# Patient Record
Sex: Female | Born: 1990 | Race: White | Hispanic: No | Marital: Single | Smoking: Current every day smoker
Health system: Southern US, Community
[De-identification: ages and names within clinical notes are randomized; demographics above are authoritative.]

## PROBLEM LIST (undated history)

## (undated) ENCOUNTER — Inpatient Hospital Stay (HOSPITAL_COMMUNITY): Payer: Self-pay

## (undated) DIAGNOSIS — K589 Irritable bowel syndrome without diarrhea: Secondary | ICD-10-CM

## (undated) DIAGNOSIS — F319 Bipolar disorder, unspecified: Secondary | ICD-10-CM

## (undated) DIAGNOSIS — F209 Schizophrenia, unspecified: Secondary | ICD-10-CM

## (undated) DIAGNOSIS — F329 Major depressive disorder, single episode, unspecified: Secondary | ICD-10-CM

## (undated) DIAGNOSIS — R3915 Urgency of urination: Secondary | ICD-10-CM

## (undated) DIAGNOSIS — O10919 Unspecified pre-existing hypertension complicating pregnancy, unspecified trimester: Secondary | ICD-10-CM

## (undated) DIAGNOSIS — R351 Nocturia: Secondary | ICD-10-CM

## (undated) DIAGNOSIS — F909 Attention-deficit hyperactivity disorder, unspecified type: Secondary | ICD-10-CM

## (undated) DIAGNOSIS — N200 Calculus of kidney: Secondary | ICD-10-CM

## (undated) DIAGNOSIS — O169 Unspecified maternal hypertension, unspecified trimester: Secondary | ICD-10-CM

## (undated) DIAGNOSIS — N289 Disorder of kidney and ureter, unspecified: Secondary | ICD-10-CM

## (undated) DIAGNOSIS — R350 Frequency of micturition: Secondary | ICD-10-CM

## (undated) DIAGNOSIS — F32A Depression, unspecified: Secondary | ICD-10-CM

## (undated) DIAGNOSIS — K649 Unspecified hemorrhoids: Secondary | ICD-10-CM

## (undated) DIAGNOSIS — F419 Anxiety disorder, unspecified: Secondary | ICD-10-CM

## (undated) DIAGNOSIS — Z8679 Personal history of other diseases of the circulatory system: Secondary | ICD-10-CM

## (undated) DIAGNOSIS — N201 Calculus of ureter: Secondary | ICD-10-CM

## (undated) DIAGNOSIS — B009 Herpesviral infection, unspecified: Secondary | ICD-10-CM

## (undated) HISTORY — PX: WISDOM TOOTH EXTRACTION: SHX21

## (undated) HISTORY — DX: Depression, unspecified: F32.A

## (undated) HISTORY — DX: Schizophrenia, unspecified: F20.9

## (undated) HISTORY — DX: Major depressive disorder, single episode, unspecified: F32.9

## (undated) HISTORY — DX: Anxiety disorder, unspecified: F41.9

---
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
## 2009-05-12 ENCOUNTER — Emergency Department (HOSPITAL_COMMUNITY): Admission: EM | Admit: 2009-05-12 | Discharge: 2009-05-12 | Payer: Self-pay | Admitting: Emergency Medicine

## 2009-06-24 IMAGING — CR DG CHEST 2V
2 series · 2 of 2 positions shown · non-contrast
Comparison: 03/29/2008.

CLINICAL DATA: Pulmonary edema.  Postpartum chest.

CHEST - 2 VIEW

[view not recorded (1 of 2)]
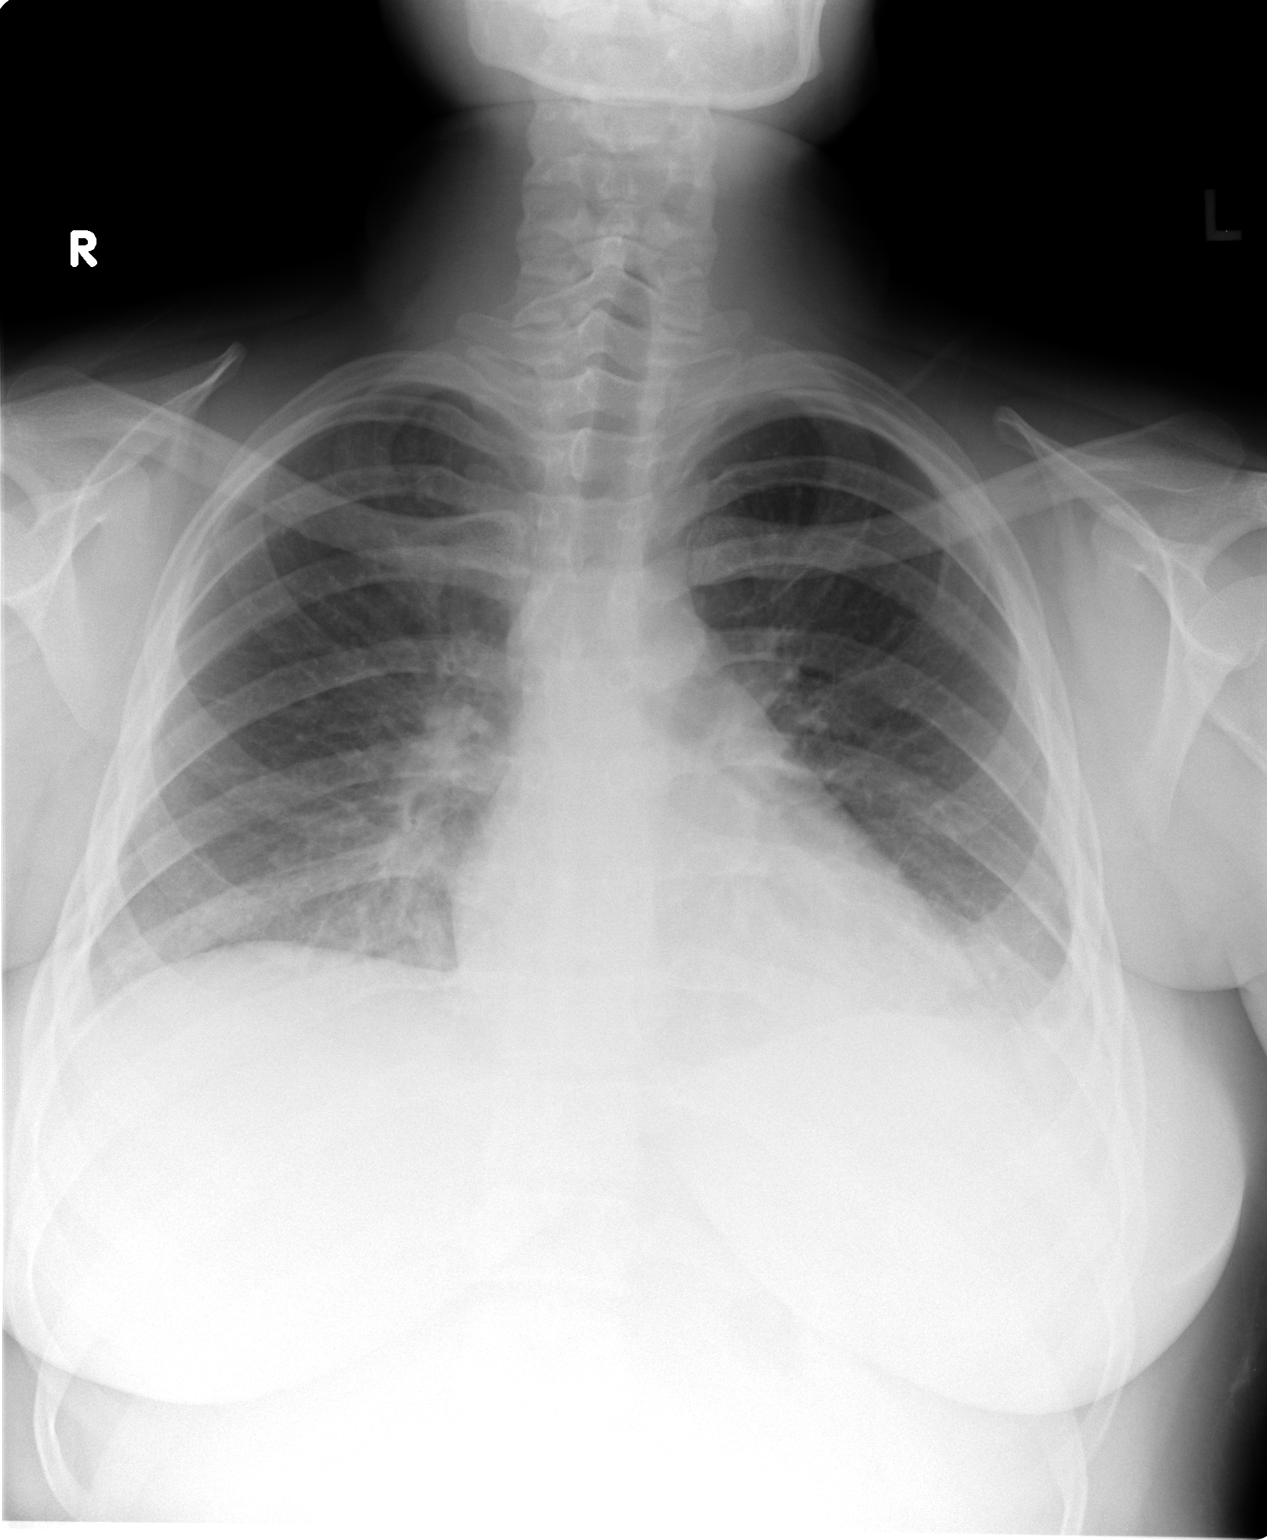

[view not recorded (2 of 2)]
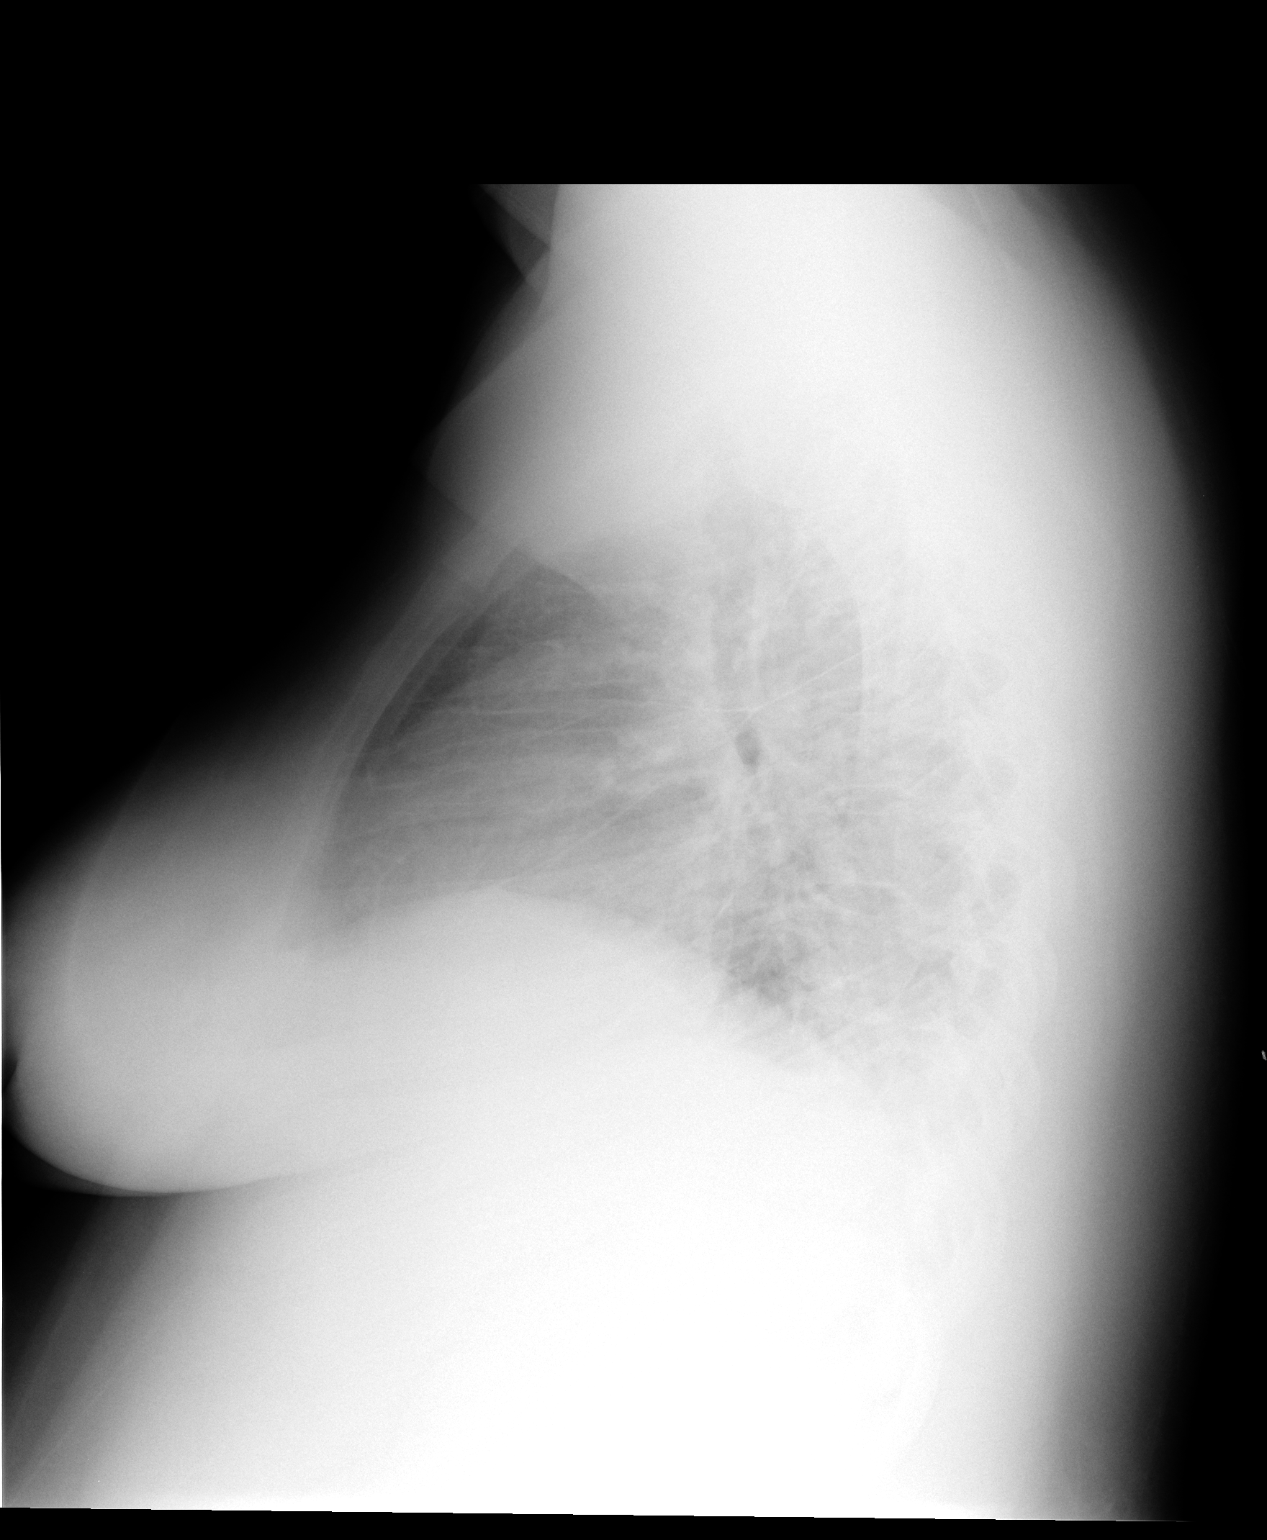

[2 of 2 positions shown; findings below may reference images not displayed]

FINDINGS: There has been improvement in the bibasilar airspace
disease, which was more pronounced at the right cardiophrenic
angle.  Findings consistent with improving pulmonary edema.
Infection felt much less likely.  Cardiomediastinal contours appear
within normal limits.  Tiny right pleural effusion is present.
IMPRESSION: 1.  Improving bibasilar airspace disease, likely representing
resolving pulmonary edema.
2.  Tiny right pleural effusion.

## 2009-10-07 ENCOUNTER — Inpatient Hospital Stay (HOSPITAL_COMMUNITY): Admission: AD | Admit: 2009-10-07 | Discharge: 2009-10-07 | Payer: Self-pay | Admitting: Obstetrics & Gynecology

## 2010-10-09 LAB — HCG, QUANTITATIVE, PREGNANCY: hCG, Beta Chain, Quant, S: <2 m[IU]/mL (ref <5)

## 2010-10-09 LAB — WET PREP, GENITAL
Trich, Wet Prep: NONE SEEN
Yeast Wet Prep HPF POC: NONE SEEN

## 2010-10-09 LAB — GC/CHLAMYDIA PROBE AMP, GENITAL
Chlamydia, DNA Probe: NEGATIVE
GC Probe Amp, Genital: NEGATIVE

## 2010-10-09 LAB — CBC
Hemoglobin: 13.9 g/dL (ref 12.0–15.0)
Platelets: 280 10*3/uL (ref 150–400)
RBC: 4.52 MIL/uL (ref 3.87–5.11)

## 2010-10-20 LAB — CBC
HCT: 38.8 % (ref 36.0–46.0)
Hemoglobin: 13.4 g/dL (ref 12.0–15.0)
MCHC: 34.6 g/dL (ref 30.0–36.0)
MCV: 90.3 fL (ref 78.0–100.0)
Platelets: 289 10*3/uL (ref 150–400)
RBC: 4.3 MIL/uL (ref 3.87–5.11)
RDW: 14 % (ref 11.5–15.5)
WBC: 8.8 10*3/uL (ref 4.0–10.5)

## 2010-10-20 LAB — URINALYSIS, ROUTINE W REFLEX MICROSCOPIC
Ketones, ur: 15 mg/dL — AB
Specific Gravity, Urine: 1.034 — ABNORMAL HIGH (ref 1.005–1.030)
pH: 6.5 (ref 5.0–8.0)

## 2010-10-20 LAB — DIFFERENTIAL
Basophils Relative: 1 % (ref 0–1)
Eosinophils Absolute: 0.2 10*3/uL (ref 0.0–0.7)
Lymphs Abs: 2.6 10*3/uL (ref 0.7–4.0)
Neutrophils Relative %: 61 % (ref 43–77)

## 2010-10-20 LAB — RAPID STREP SCREEN (MED CTR MEBANE ONLY): Streptococcus, Group A Screen (Direct): NEGATIVE
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
## 2010-11-29 NOTE — Discharge Summary (Signed)
[name] [name], [name] [name]              ACCOUNT NO.:  [id number]   MEDICAL RECORD NO.:  [id number]          PATIENT TYPE:  INP   LOCATION:  9371                          [facility]ILITY:  [facility]   PHYSICIAN:  Janine Limbo, M.D.DATE OF BIRTH:  October 02, 1990   DATE OF ADMISSION:  03/26/2008  DATE OF DISCHARGE:  03/31/2008                               DISCHARGE SUMMARY   ADMITTING DIAGNOSES:  1. Intrauterine pregnancy at 74 and 5/7th weeks.  2. Preeclampsia.   DISCHARGE DIAGNOSES:  1. Intrauterine pregnancy at 76 and 6/7th weeks.  2. Preeclampsia.  3. Secondary midline laceration.  4. Postpartum hemorrhage.  5. Postpartum anemia with hemodynamic stability.  6. Pulmonary edema, now resolved.   PROCEDURES:  1. Normal spontaneous vaginal birth.  2. Repair secondary midline laceration.  3. Epidural anesthesia.  4. Magnesium sulfate therapy.   HOSPITAL COURSE:  [name] is an 20 year old gravida 1, para 0 at 53  and 5/7th weeks who presented for admission on the evening of March 26, 2008, with elevated blood pressure and proteinuria.  She reported a  mild headache, no visual symptoms, and no epigastric pain.  Her cervix  in the office was 3-4 cm.  Pregnancy has been remarkable for,  1. Age 24.  2. ADD and bipolar.  3. History of LGA at 34 weeks.  4. Late to care.  5. Group B strep negative.  6. Newly-diagnosed preeclampsia.   On admission, blood pressures were in the 180s over 120s-130s, other  vital signs were stable.  Fetal heart rate was reactive.  She had no  contractions.  She had no hyperreflexia.  She did have 1 to 2+ edema.  She was given 10 mg of IV labetalol x1 dose and then begun on magnesium  sulfate therapy.  Pitocin was also begun.  The patient did refuse a cath  UA for protein.  She had had a 1+ and 3+ urine and protein on a voided  specimen in the office.  Later that evening, Dr. Stefano Gaul ruptured her  membranes when her cervix was 4 cm, 80% vertex, -2.  Fluid was  clear.  PIH labs were within normal limits.  Hemoglobin was 12.3, platelet count  was 226,000, comprehensive metabolic panel was normal, LDH was 224, uric  acid was 3.7.  Through the rest of her labor, the patient did require  occasional IV labetalol.  She was complete at approximately 4:00 a.m.  She then delivered at 6:01 a viable female, weight 6 pounds 4 ounces.  Apgars were 7 and 9.  There was a body cord noted.  She had a second-  degree midline laceration.  She did have a postpartum hemorrhage  immediately after delivery, 80 mcg Cytotec were given rectally with  resolution of the issue.  Placenta was sent to Path.  The patient  tolerated the procedure well and was taken to recovery phase in good  condition.  Infant was taken to the full-term nursery.  By postpartum  day #1, the patient had a social work consult given their 20 and  history of bipolar, ADHD, and depression, Child psychotherapist  felt like they  had the resources that they needed and that interactions were  appropriate.  The patient was somewhat frustrated on day 1 due to all  the machines and interventions that were required to take care of her.  She had gained 2 pounds in 24 hours.  Her blood pressure was still  159/97.  Her potassium was slightly low.  Labs otherwise within normal  limits except CBC was not done.  She had 2+ pitting edema.  She did  require increasing labetalol 300 mg t.i.d. p.o., rest of the day she was  fairly stable.   By the morning of March 29, 2008, the patient had had to receive  Lasix 20 mg at approximately 3:00 a.m. with an episode of shortness of  breath and rales, those were then resolved.  She did have a chest x-ray  that showed right basilar pulmonary consolidation worrisome for  pneumonia; however, in light of the normal white blood cell count and no  fever, the likelihood was more that she had some fluid overload.  Labetalol was increased again and magnesium sulfate was  discontinued.  Her hemoglobin at that time was 7.9, platelet count was 182, and white  blood cell count was 8.3.  Comprehensive metabolic panel was normal.  By  postpartum day #3, the patient continued to have some cough, but did not  have a body aches.  She had required some labetalol 3 doses during the  night.  Blood pressure was 180/104.  O2 sat did go down to the 80s while  sleeping, they were 96-97 while awake.  She had gained almost another  pound, all those status post Lasix.  She had pulled off quite a bit of  fluid.  Her hemoglobin that day was 7.6, white blood cell count was 7,  potassium was 3.6.  Liver function tests were normal.  Chest x-ray was  reviewed with Dr. Kyung Rudd, it now seemed more consistent with  interstitial edema.  Case management continued to follow the patient for  needs at home.   By the morning of day #4 postpartum, the patient was hoping to go home.  Her blood pressures were improved in the 140s-160s over 58-89.  Other  vital signs were stable.  Sats were within normal limits on room air.  Her weight was 239.  She had been 252 on March 29, 2008.  Her chest  was clear.  Her hemoglobin was 8.4, white blood cell count 7.8, platelet  count was 264, and comprehensive metabolic panel was normal.  She was in  a negative fluid balance of -1695.  Dr. Stefano Gaul saw the patient, he did  discuss with the patient her situation.  She did want to go home, so Dr.  Stefano Gaul decided to allow her to do this with plan made to continue the  Procardia XL 90 mg daily and hydrochlorothiazide 25 mg p.o. daily.  The  patient was agreeable with the plan and PIH precautions reviewed.  The  patient was deemed to receive full benefit of her hospital stay by the  late afternoon on March 31, 2008, and was discharged home.  Discharge instructions per [name] handout.  Also PIH  precautions were reviewed with the patient.   DISCHARGE MEDICATIONS:  1. Procardia XL 90 mg  p.o. daily.  2. Hydrochlorothiazide 25 mg p.o. daily.  3. Motrin 600 mg p.o. q.6 h. p.r.n. pain.  4. Percocet 5/325, 1-2 q.3-4 h. p.r.n. pain.   DISCHARGE FOLLOWUP:  Will  occur by [facility] Nurse on Thursday or  Friday of this week and then as needed in the office prior to the  routine 6-week visit.  The patient is also to return to the office in 1  week for reevaluation.      Renaldo Reel Emilee Hero, C.N.M.      Janine Limbo, M.D.  Electronically Signed    VLL/MEDQ  D:  03/31/2008  T:  04/01/2008  Job:  [id number]
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
## 2011-01-31 ENCOUNTER — Emergency Department (HOSPITAL_COMMUNITY)
Admission: EM | Admit: 2011-01-31 | Discharge: 2011-01-31 | Disposition: A | Discharge status: Home / Self Care | Payer: Medicaid Other | Attending: Emergency Medicine | Admitting: Emergency Medicine

## 2011-01-31 DIAGNOSIS — I10 Essential (primary) hypertension: Secondary | ICD-10-CM | POA: Insufficient documentation

## 2011-01-31 DIAGNOSIS — M545 Low back pain, unspecified: Secondary | ICD-10-CM | POA: Insufficient documentation

## 2011-01-31 DIAGNOSIS — F411 Generalized anxiety disorder: Secondary | ICD-10-CM | POA: Insufficient documentation

## 2011-04-14 LAB — URINALYSIS, ROUTINE W REFLEX MICROSCOPIC
Bilirubin Urine: NEGATIVE
Glucose, UA: NEGATIVE
Ketones, ur: 15 — AB
Specific Gravity, Urine: 1.015

## 2011-04-14 LAB — WET PREP, GENITAL
Clue Cells Wet Prep HPF POC: NONE SEEN
Trich, Wet Prep: NONE SEEN
Yeast Wet Prep HPF POC: NONE SEEN

## 2011-04-14 LAB — STREP B DNA PROBE

## 2011-04-14 LAB — FETAL FIBRONECTIN: Fetal Fibronectin: NEGATIVE

## 2011-04-17 LAB — COMPREHENSIVE METABOLIC PANEL
AST: 33
BUN: 8
CO2: 28
Calcium: 8.2 — ABNORMAL LOW
Glucose, Bld: 82
Potassium: 3.7
Sodium: 139
Total Bilirubin: 1

## 2011-04-17 LAB — CBC
HCT: 24.8 — ABNORMAL LOW
MCV: 94.9
RBC: 2.61 — ABNORMAL LOW
RDW: 14.2

## 2011-04-19 LAB — COMPREHENSIVE METABOLIC PANEL
ALT: 8
Albumin: 2 — ABNORMAL LOW
Alkaline Phosphatase: 124 — ABNORMAL HIGH
Alkaline Phosphatase: 77
Alkaline Phosphatase: 86
Alkaline Phosphatase: 88
BUN: 2 — ABNORMAL LOW
BUN: 3 — ABNORMAL LOW
BUN: 5 — ABNORMAL LOW
BUN: 6
CO2: 23
Calcium: 7.3 — ABNORMAL LOW
Calcium: 7.8 — ABNORMAL LOW
Creatinine, Ser: 0.59
Creatinine, Ser: 0.72
GFR calc non Af Amer: >60
GFR calc non Af Amer: >60
Glucose, Bld: 100 — ABNORMAL HIGH
Glucose, Bld: 72
Glucose, Bld: 87
Glucose, Bld: 91
Potassium: 3.4 — ABNORMAL LOW
Potassium: 3.7
Sodium: 138
Total Bilirubin: 0.4
Total Protein: 4 — ABNORMAL LOW
Total Protein: 4.5 — ABNORMAL LOW
Total Protein: 4.6 — ABNORMAL LOW

## 2011-04-19 LAB — CBC
HCT: 22.2 — ABNORMAL LOW
HCT: 23.6 — ABNORMAL LOW
HCT: 23.7 — ABNORMAL LOW
HCT: 36.5
Hemoglobin: 7.6 — CL
Hemoglobin: 7.9 — CL
Hemoglobin: 8 — ABNORMAL LOW
MCHC: 34
MCV: 93.5
MCV: 93.6
Platelets: 162
Platelets: 179
Platelets: 226
RBC: 2.51 — ABNORMAL LOW
RBC: 3.91
RDW: 13.6
RDW: 14.1
RDW: 14.1
WBC: 10.7 — ABNORMAL HIGH
WBC: 9.7

## 2011-04-19 LAB — LACTATE DEHYDROGENASE: LDH: 224

## 2011-04-19 LAB — URINALYSIS, DIPSTICK ONLY
Ketones, ur: NEGATIVE
Leukocytes, UA: NEGATIVE
Nitrite: NEGATIVE
Protein, ur: 30 — AB
Urobilinogen, UA: 0.2

## 2011-04-19 LAB — RPR: RPR Ser Ql: NONREACTIVE

## 2011-04-19 LAB — MAGNESIUM: Magnesium: 4.5 — ABNORMAL HIGH

## 2011-04-30 ENCOUNTER — Inpatient Hospital Stay (INDEPENDENT_AMBULATORY_CARE_PROVIDER_SITE_OTHER)
Admission: RE | Admit: 2011-04-30 | Discharge: 2011-04-30 | Disposition: A | Discharge status: Home / Self Care | Payer: Medicaid Other | Source: Ambulatory Visit | Attending: Family Medicine | Admitting: Family Medicine

## 2011-04-30 DIAGNOSIS — N390 Urinary tract infection, site not specified: Secondary | ICD-10-CM

## 2011-04-30 LAB — POCT URINALYSIS DIP (DEVICE)
Bilirubin Urine: NEGATIVE
Glucose, UA: NEGATIVE mg/dL
Ketones, ur: 15 mg/dL — AB
Nitrite: NEGATIVE
Protein, ur: 30 mg/dL — AB
Specific Gravity, Urine: 1.01 (ref 1.005–1.030)
Urobilinogen, UA: 0.2 mg/dL (ref 0.0–1.0)
pH: >=9 (ref 5.0–8.0)

## 2011-04-30 LAB — POCT PREGNANCY, URINE: Preg Test, Ur: NEGATIVE

## 2011-05-01 LAB — URINE CULTURE: Culture  Setup Time: 201210141853

## 2011-07-03 ENCOUNTER — Emergency Department (HOSPITAL_COMMUNITY)
Admission: EM | Admit: 2011-07-03 | Discharge: 2011-07-03 | Discharge status: Against Medical Advice | Payer: Medicaid Other | Attending: Emergency Medicine | Admitting: Emergency Medicine

## 2011-07-03 ENCOUNTER — Encounter: Payer: Self-pay | Admitting: *Deleted

## 2011-07-03 DIAGNOSIS — Z0389 Encounter for observation for other suspected diseases and conditions ruled out: Secondary | ICD-10-CM | POA: Insufficient documentation

## 2011-07-03 NOTE — ED Notes (Signed)
Pt states she went to urgent care couple of weeks ago for n/v and back pain. Pt states they dx her with uti. Pt state pain has increased.

## 2011-07-18 HISTORY — PX: KIDNEY STONE SURGERY: SHX686

## 2011-08-09 ENCOUNTER — Emergency Department (HOSPITAL_COMMUNITY)
Admission: EM | Admit: 2011-08-09 | Discharge: 2011-08-09 | Disposition: A | Discharge status: Home / Self Care | Payer: Medicaid Other | Attending: Emergency Medicine | Admitting: Emergency Medicine

## 2011-08-09 ENCOUNTER — Encounter (HOSPITAL_COMMUNITY): Payer: Self-pay | Admitting: *Deleted

## 2011-08-09 ENCOUNTER — Emergency Department (HOSPITAL_COMMUNITY): Payer: Medicaid Other

## 2011-08-09 DIAGNOSIS — R112 Nausea with vomiting, unspecified: Secondary | ICD-10-CM | POA: Insufficient documentation

## 2011-08-09 DIAGNOSIS — R1032 Left lower quadrant pain: Secondary | ICD-10-CM | POA: Insufficient documentation

## 2011-08-09 DIAGNOSIS — N200 Calculus of kidney: Secondary | ICD-10-CM

## 2011-08-09 DIAGNOSIS — I10 Essential (primary) hypertension: Secondary | ICD-10-CM | POA: Insufficient documentation

## 2011-08-09 DIAGNOSIS — N390 Urinary tract infection, site not specified: Secondary | ICD-10-CM | POA: Insufficient documentation

## 2011-08-09 DIAGNOSIS — N1330 Unspecified hydronephrosis: Secondary | ICD-10-CM | POA: Insufficient documentation

## 2011-08-09 DIAGNOSIS — N201 Calculus of ureter: Secondary | ICD-10-CM | POA: Insufficient documentation

## 2011-08-09 DIAGNOSIS — R300 Dysuria: Secondary | ICD-10-CM | POA: Insufficient documentation

## 2011-08-09 LAB — URINE CULTURE: Culture  Setup Time: 201301232019

## 2011-08-09 LAB — COMPREHENSIVE METABOLIC PANEL
ALT: 14 U/L (ref 0–35)
AST: 9 U/L (ref 0–37)
Alkaline Phosphatase: 80 U/L (ref 39–117)
CO2: 30 mEq/L (ref 19–32)
Chloride: 97 mEq/L (ref 96–112)
GFR calc non Af Amer: 74 mL/min — ABNORMAL LOW (ref >90)
Sodium: 136 mEq/L (ref 135–145)
Total Bilirubin: 0.4 mg/dL (ref 0.3–1.2)

## 2011-08-09 LAB — CBC
HCT: 33.5 % — ABNORMAL LOW (ref 36.0–46.0)
Platelets: 403 10*3/uL — ABNORMAL HIGH (ref 150–400)
RBC: 3.69 MIL/uL — ABNORMAL LOW (ref 3.87–5.11)
RDW: 16.5 % — ABNORMAL HIGH (ref 11.5–15.5)
WBC: 18.5 10*3/uL — ABNORMAL HIGH (ref 4.0–10.5)

## 2011-08-09 LAB — URINE MICROSCOPIC-ADD ON

## 2011-08-09 LAB — URINALYSIS, ROUTINE W REFLEX MICROSCOPIC
Bilirubin Urine: NEGATIVE
Ketones, ur: NEGATIVE mg/dL
Specific Gravity, Urine: 1.024 (ref 1.005–1.030)
Urobilinogen, UA: 1 mg/dL (ref 0.0–1.0)

## 2011-08-09 LAB — DIFFERENTIAL
Basophils Absolute: 0 10*3/uL (ref 0.0–0.1)
Lymphocytes Relative: 13 % (ref 12–46)
Neutro Abs: 14.9 10*3/uL — ABNORMAL HIGH (ref 1.7–7.7)

## 2011-08-09 MED ORDER — HYDROMORPHONE HCL PF 1 MG/ML IJ SOLN
1.0000 mg | Freq: Once | INTRAMUSCULAR | Status: DC
  Filled 2011-08-09: qty 1

## 2011-08-09 MED ORDER — OXYCODONE-ACETAMINOPHEN 5-325 MG PO TABS: 1.0000 | ORAL_TABLET | Freq: Four times a day (QID) | ORAL | Status: AC | PRN

## 2011-08-09 MED ORDER — SODIUM CHLORIDE 0.9 % IV BOLUS (SEPSIS): 1000.0000 mL | Freq: Once | INTRAVENOUS | Status: DC

## 2011-08-09 MED ORDER — HYDROMORPHONE HCL PF 1 MG/ML IJ SOLN
1.0000 mg | Freq: Once | INTRAMUSCULAR | Status: AC
  Administered 2011-08-09: 1 mg via INTRAVENOUS
  Filled 2011-08-09: qty 1

## 2011-08-09 MED ORDER — DEXTROSE 5 % IV SOLN
1.0000 g | INTRAVENOUS | Status: DC
  Administered 2011-08-09: 22:00:00 via INTRAVENOUS
  Filled 2011-08-09: qty 10

## 2011-08-09 MED ORDER — ONDANSETRON HCL 4 MG/2ML IJ SOLN
4.0000 mg | Freq: Once | INTRAMUSCULAR | Status: AC
  Administered 2011-08-09: 4 mg via INTRAVENOUS
  Filled 2011-08-09: qty 2

## 2011-08-09 MED ORDER — HYDROMORPHONE HCL PF 1 MG/ML IJ SOLN
1.0000 mg | Freq: Once | INTRAMUSCULAR | Status: AC
  Administered 2011-08-09: 1 mg via INTRAMUSCULAR

## 2011-08-09 MED ORDER — SULFAMETHOXAZOLE-TRIMETHOPRIM 800-160 MG PO TABS: 1.0000 | ORAL_TABLET | Freq: Two times a day (BID) | ORAL | Status: AC

## 2011-08-09 NOTE — ED Notes (Signed)
Pt has had abdominal pain with n/v since October.  Pt was treated for UTI in October.  Currently on her period.  Pt reports fever and chills with this.  Pt abdomen is soft.  Pain also in back

## 2011-08-09 NOTE — ED Provider Notes (Signed)
History     CSN: [id number]  Arrival date & time 08/09/11  1627   First MD Initiated Contact with Patient 08/09/11 1818      Chief Complaint  Patient presents with  . Abdominal Pain    (Consider location/radiation/quality/duration/timing/severity/associated sxs/prior treatment) HPI Comments: Patient initially seen by Dr. Jonny Ruiz.  She does again have evidence of a urinary tract infection, although it is concerning that this has been ongoing since October.  He performed a pelvic exam, which was noncontributory to her overall diagnosis.  I have been waiting for the result of the CT scan, which reveals a large, obstructing 8 mm stone on left.  I will refer patient to urology.  Treat with antibiotics for the UTI, as well as pain control  The history is provided by the patient.    Past Medical History  Diagnosis Date  . Hypertension     History reviewed. No pertinent past surgical history.  No family history on file.  History  Substance Use Topics  . Smoking status: Former Games developer  . Smokeless tobacco: Not on file  . Alcohol Use: No    OB History    Grav Para Term Preterm Abortions TAB SAB Ect Mult Living                  Review of Systems  Allergies  Review of patient's allergies indicates no known allergies.  Home Medications   Current Outpatient Rx  Name Route Sig Dispense Refill  . IBUPROFEN 200 MG PO TABS Oral Take 400 mg by mouth every 6 (six) hours as needed. For pain.     . OXYCODONE-ACETAMINOPHEN 5-325 MG PO TABS Oral Take 1-2 tablets by mouth every 6 (six) hours as needed for pain. 20 tablet 0  . SULFAMETHOXAZOLE-TRIMETHOPRIM 800-160 MG PO TABS Oral Take 1 tablet by mouth 2 (two) times daily. 28 tablet 0    BP 122/84  Pulse 113  Temp(Src) 100.4 F (38 C) (Oral)  Resp 16  SpO2 98%  Physical Exam  ED Course  Procedures (including critical care time)  Labs Reviewed  URINALYSIS, ROUTINE W REFLEX MICROSCOPIC - Abnormal; Notable for the following:    APPearance HAZY (*)    pH 8.5 (*)    Hgb urine dipstick LARGE (*)    Protein, ur 100 (*)    Leukocytes, UA MODERATE (*)    All other components within normal limits  URINE MICROSCOPIC-ADD ON - Abnormal; Notable for the following:    Squamous Epithelial / LPF FEW (*)    All other components within normal limits  CBC - Abnormal; Notable for the following:    WBC 18.5 (*)    RBC 3.69 (*)    Hemoglobin 10.8 (*)    HCT 33.5 (*)    RDW 16.5 (*)    Platelets 403 (*)    All other components within normal limits  DIFFERENTIAL - Abnormal; Notable for the following:    Neutrophils Relative 80 (*)    Neutro Abs 14.9 (*)    Monocytes Absolute 1.2 (*)    All other components within normal limits  COMPREHENSIVE METABOLIC PANEL - Abnormal; Notable for the following:    Albumin 3.2 (*)    GFR calc non Af Amer 74 (*)    GFR calc Af Amer 85 (*)    All other components within normal limits  POCT PREGNANCY, URINE  POCT PREGNANCY, URINE  URINE CULTURE  GC/CHLAMYDIA PROBE AMP, GENITAL   Ct Abdomen Pelvis Wo Contrast  08/09/2011  *RADIOLOGY REPORT*  Clinical Data: Left-sided flank pain.  Abdominal pain.  Nausea vomiting and chills.  CT ABDOMEN AND PELVIS WITHOUT CONTRAST  Technique:  Multidetector CT imaging of the abdomen and pelvis was performed following the standard protocol without intravenous contrast.  Comparison: None available.  Findings: The lung bases are clear without focal nodule, mass, or airspace disease.  Heart size is normal.  The no significant pleural or pericardial effusion is present.  The liver and spleen are within normal limits.  The stomach is within normal limits.  A duodenal diverticulum is present without focal inflammation.  The pancreas is within normal limits.  The common bile duct and gallbladder are normal.  The adrenal glands are normal bilaterally.  The acute punctate nonobstructing stones are present in the right kidney.  The moderate left-sided hydronephrosis is present.   Inflammatory changes are evident throughout the ureter to the level of an 8 mm obstructing distal left ureteral stone.  The urinary bladder is mostly collapsed.  The rectosigmoid colon is within normal limits.  The remainder of the colon is unremarkable.  The appendix is visualized and normal. The small bowel is within normal limits.  Small left pararenal lymph nodes are likely reactive.  No other significant adenopathy or free fluid is present.  The uterus and adnexa are within normal limits.  The bone windows are unremarkable.  IMPRESSION:  1.  Obstructing distal 8 mm left ureteral stone with moderate left- sided hydroureteronephrosis. 2.  Two punctate nonobstructing stones in the right kidney. 3.  Left pararenal adenopathy is likely reactive. 4.  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  Original Report Authenticated By: Jamesetta Orleans. MATTERN, M.D.     1. Urinary tract infection   2. Kidney stone on left side   3. Hydronephrosis, left       MDM  Urinary tract infection 8 mm left obstructing renal stone        Arman Filter, NP 08/10/11 [phone]

## 2011-08-09 NOTE — ED Provider Notes (Signed)
History     CSN: [id number]  Arrival date & time 08/09/11  1627   First MD Initiated Contact with Patient 08/09/11 1818      Chief Complaint  Patient presents with  . Abdominal Pain    (Consider location/radiation/quality/duration/timing/severity/associated sxs/prior treatment) HPI Patient presents to the emergency room with complaints of nausea and vomiting since October. Patient states she was seen in the emergency department and was treated for a urinary tract infection in October. Patient states the symptoms never got better after taking antibiotics. She continues to have nausea and vomiting frequently but states she never saw anyone else because her doctor's offices under construction and she would like to see her own doctor. Patient denies any fevers. She has some discomfort when she urinates. She denies any blood in her stool. She has not had any prior surgeries. Nothing seems to better or worse. Currently the pain is primarily in the lower left abdomen. Past Medical History  Diagnosis Date  . Hypertension     History reviewed. No pertinent past surgical history.  No family history on file.  History  Substance Use Topics  . Smoking status: Former Games developer  . Smokeless tobacco: Not on file  . Alcohol Use: No    OB History    Grav Para Term Preterm Abortions TAB SAB Ect Mult Living                  Review of Systems  All other systems reviewed and are negative.    Allergies  Review of patient's allergies indicates no known allergies.  Home Medications   Current Outpatient Rx  Name Route Sig Dispense Refill  . IBUPROFEN 200 MG PO TABS Oral Take 400 mg by mouth every 6 (six) hours as needed. For pain.       BP 122/84  Pulse 113  Temp(Src) 100.4 F (38 C) (Oral)  Resp 16  SpO2 98%  Physical Exam  Nursing note and vitals reviewed. Constitutional: She appears well-developed and well-nourished. No distress.  HENT:  Head: Normocephalic and atraumatic.    Right Ear: External ear normal.  Left Ear: External ear normal.  Eyes: Conjunctivae are normal. Right eye exhibits no discharge. Left eye exhibits no discharge. No scleral icterus.  Neck: Neck supple. No tracheal deviation present.  Cardiovascular: Normal rate, regular rhythm and intact distal pulses.   Pulmonary/Chest: Effort normal and breath sounds normal. No stridor. No respiratory distress. She has no wheezes. She has no rales.  Abdominal: Soft. Bowel sounds are normal. She exhibits no distension. There is tenderness in the left lower quadrant. There is no rigidity, no rebound, no guarding and negative Murphy's sign.  Musculoskeletal: She exhibits no edema and no tenderness.  Neurological: She is alert. She has normal strength. No sensory deficit. Cranial nerve deficit:  no gross defecits noted. She exhibits normal muscle tone. She displays no seizure activity. Coordination normal.  Skin: Skin is warm and dry. No rash noted.  Psychiatric: She has a normal mood and affect.    ED Course  Procedures (including critical care time)  Labs Reviewed  URINALYSIS, ROUTINE W REFLEX MICROSCOPIC - Abnormal; Notable for the following:    APPearance HAZY (*)    pH 8.5 (*)    Hgb urine dipstick LARGE (*)    Protein, ur 100 (*)    Leukocytes, UA MODERATE (*)    All other components within normal limits  URINE MICROSCOPIC-ADD ON - Abnormal; Notable for the following:    Squamous  Epithelial / LPF FEW (*)    All other components within normal limits  POCT PREGNANCY, URINE  POCT PREGNANCY, URINE  CBC  DIFFERENTIAL  COMPREHENSIVE METABOLIC PANEL   No results found.     MDM  Pt with elevated WBC.  Denies vaginal discharge.  Symptoms may be related to pyelo however she states her symptoms have been ongoing since October.  Will check pelvic exam, consider abdominal imaging.       Celene Kras, MD 08/09/11 [phone]

## 2011-08-10 LAB — GC/CHLAMYDIA PROBE AMP, GENITAL: GC Probe Amp, Genital: NEGATIVE

## 2011-08-10 NOTE — ED Notes (Signed)
Pt called had tried to make appt w/ urology as referred but needed [facility] to call office.  I called office and scheduled appt for pt 1/31 @ 10 am w/ Timmothy Sours NP.  Pt instructed to bring her insurance card, a picture ID, medication list and the new pt forms (filled out) that the urology office is sending her.

## 2011-08-10 NOTE — ED Provider Notes (Signed)
Medical screening examination/treatment/procedure(s) were conducted as a shared visit with non-physician practitioner(s) and myself.  I personally evaluated the patient during the encounter   [name] Kras, MD 08/10/11 [phone]

## 2011-08-22 ENCOUNTER — Other Ambulatory Visit: Payer: Self-pay | Admitting: Urology

## 2011-08-23 ENCOUNTER — Encounter (HOSPITAL_BASED_OUTPATIENT_CLINIC_OR_DEPARTMENT_OTHER): Payer: Self-pay | Admitting: *Deleted

## 2011-08-23 NOTE — Progress Notes (Signed)
NPO AFTER MN. ARRIVES AT 1130. ASK MDA IF HH AND URINE PREG NEEDS TO BE REPEATED. DONE AT ED 08-09-11. MAY TAKE PAIN MED IF NEEDED W/ SIP OF WATER AM OF SURG.

## 2011-08-24 ENCOUNTER — Other Ambulatory Visit: Payer: Self-pay | Admitting: Urology

## 2011-08-24 NOTE — H&P (Addendum)
History of Present Illness      F/u ER follow-up. Was seen 08/09/11 for pelvic pain and chronic UTI since 10/12. CTU: tiny nonobstructing right renal calculi. Moderate left hydronephrosis secondary to a 8 mm distal left ureteral calculus. Left pararenal adeonpathy is most likely reactive. This is her 1st stone episode.  Started PRN Oxycodone with moderate control of pain. Was started on SMZ-TMP DS 1 po BID. She was started on tamsulosin.   KUB Aug 17, 2011  - reveals 8 mm stone at left UVJ.   Interval Hx: Patient continues to have less left flank pain. She had one episode of nausea with emesis yesterday. She has not had emesis today and had no fevers or chills. No dysuria. The pain is changing in that it is causing more frequency and urgency and pain in the left vaginal area.   Past Medical History Problems  1. History of  Depression 311 2. History of  Esophageal Reflux 530.81 3. History of  Hypertension 401.9 4. History of  Nephrolithiasis V13.01  Surgical History Problems  1. History of  Oral Surgery Tooth Extraction  Current Meds 1. Advil TABS; Therapy: (Recorded:31Jan2013) to 2. Oxycodone-Acetaminophen 5-325 MG Oral Tablet; TAKE 1 TO 2 TABLETS EVERY 4 TO 6 HOURS  AS NEEDED; Therapy: 31Jan2013 to (Evaluate:03Feb2013); Last Rx:31Jan2013 3. Sulfamethoxazole-Trimethoprim TABS; Therapy: (Recorded:31Jan2013) to 4. Tamsulosin HCl 0.4 MG Oral Capsule; TAKE 1 CAPSULE Daily; Therapy: 31Jan2013 to  (Evaluate:14Feb2013)  Requested for: 31Jan2013; Last Rx:31Jan2013  Allergies Medication  1. No Known Drug Allergies  Family History Problems  1. Family history of  Family Health Status Number Of Children 1 son  Social History Problems  1. Caffeine Use one every few days 2. Marital History - Single 3. Tobacco Use 305.1 One cigarette a day 4. Unemployed Denied  5. History of  Alcohol Use  Review of Systems Constitutional, cardiovascular, pulmonary, neurological and psychiatric  system(s) were reviewed and pertinent findings if present are noted.    Physical Exam Constitutional: Well nourished and well developed . No acute distress.  Pulmonary: No respiratory distress and normal respiratory rhythm and effort.  Cardiovascular: Heart rate and rhythm are normal . No peripheral edema.  Abdomen: No CVA tenderness.  Neuro/Psych:. Mood and affect are appropriate.    Results/Data Urine [Data Includes: Last 1 Day]   04Feb2013  COLOR YELLOW   APPEARANCE CLEAR   SPECIFIC GRAVITY 1.020   pH 6.0   GLUCOSE NEG mg/dL  BILIRUBIN NEG   KETONE NEG mg/dL  BLOOD MOD   PROTEIN NEG mg/dL  UROBILINOGEN 0.2 mg/dL  NITRITE NEG   LEUKOCYTE ESTERASE NEG   SQUAMOUS EPITHELIAL/HPF FEW   WBC 4-6 WBC/hpf  RBC 7-10 RBC/hpf  BACTERIA RARE   CRYSTALS NONE SEEN   CASTS NONE SEEN    Procedure  KUB today-stone is more medial possibly in the bladder or at least left ureterovesical junction. It appears to be progressing. Normal bowel gas pattern.     Assessment Assessed  1. Distal Ureteral Stone On The Left 592.1 2. Hydronephrosis On The Left 591  Plan Distal Ureteral Stone On The Left (592.1), Hydronephrosis On The Left (591)  1. Follow-up Schedule Surgery Office  Follow-up  Requested for: 04Feb2013 2. URINE CULTURE  Requested for: 04Feb2013 Health Maintenance (V70.0)  3. UA With REFLEX  Done: 04Feb2013 02:29PM Nephrolithiasis (592.0), Hydronephrosis On The Left (591)  4. Oxycodone-Acetaminophen 5-325 MG Oral Tablet; TAKE 1 TO 2 TABLETS EVERY 4 TO 6 HOURS  AS NEEDED; Therapy: 04Feb2013 to (  Evaluate:07Feb2013); Last Rx:04Feb2013 5. KUB  Done: 04Feb2013 12:00AM 6. URINE CULTURE  Done: 04Feb2013  Discussion/Summary   We reviewed the CT and KUB images today. We discussed the nature risks benefits of continued stone passage with medical expulsion therapy, cystoscopy with stent placement possible ureteroscopy laser lithotripsy, left extracorporeal shockwave lithotripsy. All  questions were answered and she elects to proceed with ureteroscopy. She understands we may have to leave the stent and undergo a staged procedure. Hopefully she will pass the stone before surgery. She is on a lot of discomfort and wants to go ahead and have something set up for later in the week. We also discussed the likelihood of achieving the goals of the procedure and potential problems that might occur during the procedure or recuperation.  Urine Cx grew 40 k staph species S to cipro. PO abx sent. Also added to pre-op Abx.   H&P Update I have reviewed the history and examined the patient.  There are no changes to the history and physical. Pt is well. No dysuria. No fever or chills. She has not passed stone. Stone still visible on KUB left of midline - likely in intramural ureter. I discussed again with the patient the nature, potential benefits, risks and alternatives to cysto/stent/ureteroscopy/laser litho, including side effects of the proposed treatment, the likelihood of the patient achieving the goals of the procedure, and any potential problems that might occur during the procedure or recuperation. All questions answered. Patient elects to proceed.

## 2011-08-25 ENCOUNTER — Encounter (HOSPITAL_BASED_OUTPATIENT_CLINIC_OR_DEPARTMENT_OTHER): Payer: Self-pay | Admitting: Anesthesiology

## 2011-08-25 ENCOUNTER — Encounter (HOSPITAL_BASED_OUTPATIENT_CLINIC_OR_DEPARTMENT_OTHER)
Admission: RE | Disposition: A | Discharge status: Home / Self Care | Payer: Self-pay | Source: Ambulatory Visit | Attending: Urology

## 2011-08-25 ENCOUNTER — Ambulatory Visit (HOSPITAL_BASED_OUTPATIENT_CLINIC_OR_DEPARTMENT_OTHER): Payer: Medicaid Other | Admitting: Anesthesiology

## 2011-08-25 ENCOUNTER — Ambulatory Visit (HOSPITAL_BASED_OUTPATIENT_CLINIC_OR_DEPARTMENT_OTHER)
Admission: RE | Admit: 2011-08-25 | Discharge: 2011-08-25 | Disposition: A | Discharge status: Home / Self Care | Payer: Medicaid Other | Source: Ambulatory Visit | Attending: Urology | Admitting: Urology

## 2011-08-25 ENCOUNTER — Encounter (HOSPITAL_BASED_OUTPATIENT_CLINIC_OR_DEPARTMENT_OTHER): Payer: Self-pay | Admitting: *Deleted

## 2011-08-25 ENCOUNTER — Ambulatory Visit (HOSPITAL_COMMUNITY): Payer: Medicaid Other

## 2011-08-25 DIAGNOSIS — Z79899 Other long term (current) drug therapy: Secondary | ICD-10-CM | POA: Insufficient documentation

## 2011-08-25 DIAGNOSIS — I10 Essential (primary) hypertension: Secondary | ICD-10-CM | POA: Insufficient documentation

## 2011-08-25 DIAGNOSIS — K219 Gastro-esophageal reflux disease without esophagitis: Secondary | ICD-10-CM | POA: Insufficient documentation

## 2011-08-25 DIAGNOSIS — N201 Calculus of ureter: Secondary | ICD-10-CM | POA: Insufficient documentation

## 2011-08-25 DIAGNOSIS — R109 Unspecified abdominal pain: Secondary | ICD-10-CM | POA: Insufficient documentation

## 2011-08-25 DIAGNOSIS — N1330 Unspecified hydronephrosis: Secondary | ICD-10-CM | POA: Insufficient documentation

## 2011-08-25 HISTORY — PX: CYSTOSCOPY/RETROGRADE/URETEROSCOPY: SHX5316

## 2011-08-25 HISTORY — DX: Personal history of other diseases of the circulatory system: Z86.79

## 2011-08-25 HISTORY — DX: Bipolar disorder, unspecified: F31.9

## 2011-08-25 HISTORY — DX: Calculus of ureter: N20.1

## 2011-08-25 HISTORY — DX: Frequency of micturition: R35.0

## 2011-08-25 HISTORY — DX: Attention-deficit hyperactivity disorder, unspecified type: F90.9

## 2011-08-25 HISTORY — DX: Urgency of urination: R39.15

## 2011-08-25 HISTORY — DX: Nocturia: R35.1

## 2011-08-25 LAB — POCT PREGNANCY, URINE: Preg Test, Ur: NEGATIVE

## 2011-08-25 SURGERY — CYSTOSCOPY/RETROGRADE/URETEROSCOPY
Anesthesia: General | Site: Ureter | Laterality: Left | Wound class: Clean

## 2011-08-25 MED ORDER — CEFAZOLIN SODIUM-DEXTROSE 2-3 GM-% IV SOLR
2.0000 g | INTRAVENOUS | Status: AC
  Administered 2011-08-25: 2 g via INTRAVENOUS

## 2011-08-25 MED ORDER — IOHEXOL 350 MG/ML SOLN
INTRAVENOUS | Status: DC | PRN
  Administered 2011-08-25: 50 mL via INTRAVENOUS

## 2011-08-25 MED ORDER — CIPROFLOXACIN IN D5W 400 MG/200ML IV SOLN
400.0000 mg | Freq: Two times a day (BID) | INTRAVENOUS | Status: DC
  Administered 2011-08-25 (×2): 400 mg via INTRAVENOUS

## 2011-08-25 MED ORDER — PROMETHAZINE HCL 25 MG/ML IJ SOLN: 6.2500 mg | INTRAMUSCULAR | Status: DC | PRN

## 2011-08-25 MED ORDER — OXYCODONE-ACETAMINOPHEN 5-325 MG PO TABS: 1.0000 | ORAL_TABLET | ORAL | Status: AC | PRN

## 2011-08-25 MED ORDER — PROPOFOL 10 MG/ML IV EMUL
INTRAVENOUS | Status: DC | PRN
  Administered 2011-08-25: 180 mg via INTRAVENOUS

## 2011-08-25 MED ORDER — FENTANYL CITRATE 0.05 MG/ML IJ SOLN
INTRAMUSCULAR | Status: DC | PRN
  Administered 2011-08-25 (×2): 50 ug via INTRAVENOUS

## 2011-08-25 MED ORDER — BELLADONNA ALKALOIDS-OPIUM 16.2-60 MG RE SUPP
RECTAL | Status: DC | PRN
  Administered 2011-08-25: 1 via RECTAL

## 2011-08-25 MED ORDER — DEXAMETHASONE SODIUM PHOSPHATE 4 MG/ML IJ SOLN
INTRAMUSCULAR | Status: DC | PRN
  Administered 2011-08-25: 10 mg via INTRAVENOUS

## 2011-08-25 MED ORDER — LIDOCAINE HCL (CARDIAC) 20 MG/ML IV SOLN
INTRAVENOUS | Status: DC | PRN
  Administered 2011-08-25: 60 mg via INTRAVENOUS

## 2011-08-25 MED ORDER — CIPROFLOXACIN HCL 500 MG PO TABS: 500.0000 mg | ORAL_TABLET | Freq: Two times a day (BID) | ORAL | Status: AC

## 2011-08-25 MED ORDER — SODIUM CHLORIDE 0.9 % IR SOLN
Status: DC | PRN
  Administered 2011-08-25: 6000 mL

## 2011-08-25 MED ORDER — LACTATED RINGERS IV SOLN
INTRAVENOUS | Status: DC | PRN
  Administered 2011-08-25: 13:00:00 via INTRAVENOUS

## 2011-08-25 MED ORDER — MIDAZOLAM HCL 5 MG/5ML IJ SOLN
INTRAMUSCULAR | Status: DC | PRN
  Administered 2011-08-25: 2 mg via INTRAVENOUS

## 2011-08-25 MED ORDER — OXYCODONE-ACETAMINOPHEN 5-325 MG PO TABS
1.0000 | ORAL_TABLET | ORAL | Status: AC | PRN
  Administered 2011-08-25: 1 via ORAL

## 2011-08-25 MED ORDER — FENTANYL CITRATE 0.05 MG/ML IJ SOLN: 25.0000 ug | INTRAMUSCULAR | Status: DC | PRN

## 2011-08-25 MED ORDER — CEFAZOLIN SODIUM 1-5 GM-% IV SOLN: 1.0000 g | INTRAVENOUS | Status: DC

## 2011-08-25 MED ORDER — LACTATED RINGERS IV SOLN
INTRAVENOUS | Status: DC
  Administered 2011-08-25: 13:00:00 via INTRAVENOUS

## 2011-08-25 SURGICAL SUPPLY — 35 items
ADAPTER CATH URET PLST 4-6FR (CATHETERS) IMPLANT
ADPR CATH URET STRL DISP 4-6FR (CATHETERS)
BAG DRAIN URO-CYSTO SKYTR STRL (DRAIN) ×2 IMPLANT
BAG DRN UROCATH (DRAIN) ×1
BASKET LASER NITINOL 1.9FR (BASKET) IMPLANT
BASKET STNLS GEMINI 4WIRE 3FR (BASKET) ×1 IMPLANT
BASKET ZERO TIP NITINOL 2.4FR (BASKET) IMPLANT
BRUSH URET BIOPSY 3F (UROLOGICAL SUPPLIES) IMPLANT
BSKT STON RTRVL 120 1.9FR (BASKET)
BSKT STON RTRVL GEM 120X11 3FR (BASKET) ×1
BSKT STON RTRVL ZERO TP 2.4FR (BASKET)
CANISTER SUCT LVC 12 LTR MEDI- (MISCELLANEOUS) ×1 IMPLANT
CATH INTERMIT  6FR 70CM (CATHETERS) IMPLANT
CATH URET 5FR 28IN CONE TIP (BALLOONS)
CATH URET 5FR 28IN OPEN ENDED (CATHETERS) IMPLANT
CATH URET 5FR 70CM CONE TIP (BALLOONS) IMPLANT
CLOTH BEACON ORANGE TIMEOUT ST (SAFETY) ×2 IMPLANT
DRAPE CAMERA CLOSED 9X96 (DRAPES) ×1 IMPLANT
ELECT REM PT RETURN 9FT ADLT (ELECTROSURGICAL)
ELECTRODE REM PT RTRN 9FT ADLT (ELECTROSURGICAL) IMPLANT
GLOVE BIO SURGEON STRL SZ7.5 (GLOVE) ×2 IMPLANT
GOWN PREVENTION PLUS LG XLONG (DISPOSABLE) ×2 IMPLANT
GOWN STRL REIN XL XLG (GOWN DISPOSABLE) ×2 IMPLANT
GUIDEWIRE 0.038 PTFE COATED (WIRE) IMPLANT
GUIDEWIRE ANG ZIPWIRE 038X150 (WIRE) IMPLANT
GUIDEWIRE STR DUAL SENSOR (WIRE) ×3 IMPLANT
IV NS IRRIG 3000ML ARTHROMATIC (IV SOLUTION) ×4 IMPLANT
KIT BALLIN UROMAX 15FX10 (LABEL) IMPLANT
KIT BALLN UROMAX 15FX4 (MISCELLANEOUS) IMPLANT
KIT BALLN UROMAX 26 75X4 (MISCELLANEOUS)
PACK CYSTOSCOPY (CUSTOM PROCEDURE TRAY) ×2 IMPLANT
SET HIGH PRES BAL DIL (LABEL)
SHEATH URET ACCESS 12FR/35CM (UROLOGICAL SUPPLIES) IMPLANT
SHEATH URET ACCESS 12FR/55CM (UROLOGICAL SUPPLIES) IMPLANT
SYRINGE IRR TOOMEY STRL 70CC (SYRINGE) IMPLANT

## 2011-08-25 NOTE — Transfer of Care (Signed)
Immediate Anesthesia Transfer of Care Note  Patient: [name] [name]  Procedure(s) Performed:  CYSTOSCOPY/RETROGRADE/URETEROSCOPY - cystoscopy LEFT retrograde pyelogram LEFT URETEROSCOPY holmium  LASER LITHO AND BASKET EXTRACTION C-ARM LASER  Patient Location: PACU  Anesthesia Type: General  Level of Consciousness: sedated  Airway & Oxygen Therapy: Patient Spontanous Breathing and Patient connected to face mask oxygen  Post-op Assessment: Report given to PACU RN and Post -op Vital signs reviewed and stable  Post vital signs: Reviewed and stable  Complications: No apparent anesthesia complications

## 2011-08-25 NOTE — Discharge Instructions (Signed)
Cystoscopy (Bladder Exam), Ureteroscopy (Ureter exam), Laser lithotripsy (fragment stone), Stone removal with Basket  Refer to this sheet in the next few weeks. These discharge instructions provide you with general information on caring for yourself after you leave the hospital. Your caregiver may also give you specific instructions. Your treatment has been planned according to the most current medical practices available, but unavoidable complications sometimes occur. If you have any problems or questions after discharge, please call your caregiver.   AFTER THE PROCEDURE   There may be temporary bleeding and burning with urination.   Drink enough water and fluids to keep your urine clear or pale yellow.   *****FOLLOW-UP: Important to f/u for renal ultrasound to ensure no residual blockage of kidney from stone which could lead to permamnent kidney damage/ kidney failure*****  SEEK IMMEDIATE MEDICAL CARE IF:   There is an increase in blood in the urine or you are passing clots.   There is difficulty passing urine.   You develop the chills.   You have an oral temperature above 102 F (38.9 C), not controlled by medicine.   Belly (abdominal) pain develops.  Document Released: 01/20/2005 Document Revised: 03/15/2011 Document Reviewed: 11/18/2007 [facility] Patient Information 2012 [place], [place].

## 2011-08-25 NOTE — Anesthesia Procedure Notes (Signed)
Procedure Name: LMA Insertion Date/Time: 08/25/2011 1:00 PM Performed by: Jearld Shines Pre-anesthesia Checklist: Patient identified, Emergency Drugs available, Suction available and Patient being monitored Patient Re-evaluated:Patient Re-evaluated prior to inductionOxygen Delivery Method: Circle System Utilized Preoxygenation: Pre-oxygenation with 100% oxygen Intubation Type: IV induction Ventilation: Mask ventilation without difficulty LMA: LMA inserted LMA Size: 4.0 Number of attempts: 1 Airway Equipment and Method: bite block Placement Confirmation: positive ETCO2 Tube secured with: Tape Dental Injury: Teeth and Oropharynx as per pre-operative assessment

## 2011-08-25 NOTE — Progress Notes (Signed)
Left unit to escort patient and her boyfried to the bus stop on [address] and [place].  Bus arrived at [phone] and left after pt had embarked on bus.

## 2011-08-25 NOTE — Anesthesia Preprocedure Evaluation (Signed)
Anesthesia Evaluation  Patient identified by MRN, date of birth, ID band Patient awake    Reviewed: Allergy & Precautions, H&P , NPO status , Patient's Chart, lab work & pertinent test results, reviewed documented beta blocker date and time   Airway Mallampati: II TM Distance: >3 FB Neck ROM: Full    Dental  (+) Teeth Intact   Pulmonary neg pulmonary ROS,  clear to auscultation        Cardiovascular neg cardio ROS Regular Normal    Neuro/Psych Bipolar DzNegative Neurological ROS     GI/Hepatic negative GI ROS, Neg liver ROS,   Endo/Other  Negative Endocrine ROS  Renal/GU Kidney stone  Genitourinary negative   Musculoskeletal negative musculoskeletal ROS (+)   Abdominal   Peds negative pediatric ROS (+)  Hematology negative hematology ROS (+)   Anesthesia Other Findings   Reproductive/Obstetrics negative OB ROS                           Anesthesia Physical Anesthesia Plan  ASA: I  Anesthesia Plan: General   Post-op Pain Management:    Induction: Intravenous  Airway Management Planned: LMA  Additional Equipment:   Intra-op Plan:   Post-operative Plan: Extubation in OR  Informed Consent:   Plan Discussed with: CRNA and Surgeon  Anesthesia Plan Comments:         Anesthesia Quick Evaluation

## 2011-08-25 NOTE — Brief Op Note (Signed)
08/25/2011  1:26 PM  PATIENT:  [name] [name]  21 y.o. female  PRE-OPERATIVE DIAGNOSIS:  LEFT URETERAL STONE , hydronephrosis  POST-OPERATIVE DIAGNOSIS:  left ureteral stone  PROCEDURE:  Procedure(s): CYSTOSCOPY/Left Ureteroscopy/laser lithotripsy/stone basket removal  SURGEON:  Surgeon(s): Antony Haste, MD   ANESTHESIA:   Gen  EBL:   Zero  BLOOD ADMINISTERED:none  DRAINS: none   LOCAL MEDICATIONS USED:  B&O suppository  SPECIMEN: stone fragment  DISPOSITION OF SPECIMEN: Lab  COUNTS:  YES  TOURNIQUET:  * No tourniquets in log *  DICTATION: 213086  PLAN OF CARE: Discharge to home after PACU  PATIENT DISPOSITION:  PACU - hemodynamically stable.   Delay start of Pharmacological VTE agent (>24hrs) due to surgical blood loss or risk of bleeding:  {YES/NO/NOT APPLICABLE:20182

## 2011-08-25 NOTE — Progress Notes (Signed)
Pt unable to reach boyfriend's mother by phone, who was supposed to provide transportation home.  Several attempts made to contact her.  Pt states she came here by bus, and can go home by bus.  Boyfriend is here with here and states that he will be with her while she is on the bus and for the rest of the day.

## 2011-08-25 NOTE — Progress Notes (Signed)
Left message on pt's cell phone to come to [facility] @ 1100 or asap d/t new order for xray proir to the procedure

## 2011-08-26 NOTE — Anesthesia Postprocedure Evaluation (Signed)
  Anesthesia Post-op Note  Patient: [name] [name]  Procedure(s) Performed:  CYSTOSCOPY/RETROGRADE/URETEROSCOPY - cystoscopy LEFT retrograde pyelogram LEFT URETEROSCOPY holmium  LASER LITHO AND BASKET EXTRACTION C-ARM LASER  Patient Location: PACU  Anesthesia Type: General  Level of Consciousness: oriented and sedated  Airway and Oxygen Therapy: Patient Spontanous Breathing  Post-op Pain: mild  Post-op Assessment: Post-op Vital signs reviewed, Patient's Cardiovascular Status Stable, Respiratory Function Stable and Patent Airway  Post-op Vital Signs: stable  Complications: No apparent anesthesia complications

## 2011-08-26 NOTE — Op Note (Signed)
[name], [name] NO.:  [id number]  MEDICAL RECORD NO.:  [id number]  LOCATION:  STRE1                        FACILITY:  [facility]  PHYSICIAN:  Jerilee Field, MD   DATE OF BIRTH:  07-29-1990  DATE OF PROCEDURE: DATE OF DISCHARGE:                              OPERATIVE REPORT   PREOPERATIVE DIAGNOSES:  Left ureteral stone and left hydronephrosis.  POSTOPERATIVE DIAGNOSES:  Left ureteral stone and hydronephrosis.  PROCEDURES:  Cystoscopy, left ureteroscopy, laser lithotripsy, and stone removal with basket.  SURGEON:  Jerilee Field, MD.  TYPE OF ANESTHESIA:  General.  INDICATION FOR PROCEDURE:  [name] is a 21 year old, white female, who has had colicky left flank pain for several months. She reports she first felt pain on Oct 2012.  Recent CT scan revealed a distal left ureteral stone.  More recently, the stone has failed to progress and she has not passed it and remained symptomatic. We discussed the nature, risks, benefits and alternatives to cystoscopy with ureteroscopy, laser lithotripsy, stone basket extraction, and stent placement.  All questions were answered, and she elected to proceed with the above.  We did discuss nontreatment, continued stone passage with medical expulsion therapy and shock wave lithotripsy.  FINDINGS:  On cystoscopy, the stone was visible in the left intramural ureter crowning at the left ureteral orifice.  On ureteroscopy, the stone fragmented well.  The ureter and intramural ureter were dilated and with minimal trauma.  Therefore, no stent was left.  DESCRIPTION OF PROCEDURE:  After consent was obtained, the patient was brought to the operating room, time-out was performed confirming patient and procedure.  SCDs and preop antibiotics were given.  After adequate anesthesia, she was placed in lithotomy position, and prepped and draped in the usual fashion.  A rigid cystoscope was passed per urethra.  The bladder was  visualized and appeared normal, and the urethra was normal. Sensor wire was then advanced through the left ureteral orifice adjacent to the stone and coiled in the kidney under fluoroscopic guidance.  A rigid ureteroscope was then passed into the left UO without difficulty, where a large stone was visualized.  It was then fragmented with a 200 micron laser fiber at 0.5 and 20 into small pieces.  These pieces were swept out with [employer].  Inspection of the ureter after removal of all fragments all the way up to the junction of the midureter showed the ureter to be normal with no residual stone fragments and minimal edema and trauma from the stone and ureteroscopy.  No stent was left.  The bladder was drained and the scope was removed.  She was then awakened, taken to recovery room in stable condition.  ESTIMATED BLOOD LOSS:  Zero.  DRAINS:  None.  SPECIMEN:  Stone fragments to Pathology/lab.  COMPLICATIONS:  None.  DISPOSITION:  The patient stable to PACU.          ______________________________ Jerilee Field, MD     ME/MEDQ  D:  08/25/2011  T:  08/26/2011  Job:  [id number]

## 2011-08-28 ENCOUNTER — Encounter (HOSPITAL_BASED_OUTPATIENT_CLINIC_OR_DEPARTMENT_OTHER): Payer: Self-pay | Admitting: Urology

## 2011-12-03 ENCOUNTER — Encounter (HOSPITAL_COMMUNITY): Payer: Self-pay

## 2011-12-03 ENCOUNTER — Inpatient Hospital Stay (HOSPITAL_COMMUNITY)
Admission: AD | Admit: 2011-12-03 | Discharge: 2011-12-03 | Disposition: A | Discharge status: Home / Self Care | Payer: Medicaid Other | Source: Ambulatory Visit | Attending: Obstetrics and Gynecology | Admitting: Obstetrics and Gynecology

## 2011-12-03 DIAGNOSIS — Z331 Pregnant state, incidental: Secondary | ICD-10-CM

## 2011-12-03 DIAGNOSIS — R1110 Vomiting, unspecified: Secondary | ICD-10-CM

## 2011-12-03 DIAGNOSIS — R109 Unspecified abdominal pain: Secondary | ICD-10-CM | POA: Insufficient documentation

## 2011-12-03 DIAGNOSIS — O210 Mild hyperemesis gravidarum: Secondary | ICD-10-CM | POA: Insufficient documentation

## 2011-12-03 LAB — URINALYSIS, ROUTINE W REFLEX MICROSCOPIC
Bilirubin Urine: NEGATIVE
Ketones, ur: NEGATIVE mg/dL
Nitrite: NEGATIVE
pH: 7.5 (ref 5.0–8.0)

## 2011-12-03 LAB — URINE MICROSCOPIC-ADD ON

## 2011-12-03 MED ORDER — LACTATED RINGERS IV BOLUS (SEPSIS)
1000.0000 mL | Freq: Once | INTRAVENOUS | Status: AC
  Administered 2011-12-03: 1000 mL via INTRAVENOUS

## 2011-12-03 MED ORDER — PROMETHAZINE HCL 50 MG PO TABS: 25.0000 mg | ORAL_TABLET | ORAL | Status: DC | PRN

## 2011-12-03 MED ORDER — ONDANSETRON HCL 4 MG/2ML IJ SOLN
4.0000 mg | Freq: Once | INTRAMUSCULAR | Status: AC
  Administered 2011-12-03: 4 mg via INTRAVENOUS
  Filled 2011-12-03: qty 2

## 2011-12-03 MED ORDER — ONDANSETRON 8 MG PO TBDP: 8.0000 mg | ORAL_TABLET | Freq: Three times a day (TID) | ORAL | Status: AC | PRN

## 2011-12-03 NOTE — Discharge Instructions (Signed)
ABCs of Pregnancy A Antepartum care is very important. Be sure you see your doctor and get prenatal care as soon as you think you are pregnant. At this time, you will be tested for infection, genetic abnormalities and potential problems with you and the pregnancy. This is the time to discuss diet, exercise, work, medications, labor, pain medication during labor and the possibility of a cesarean delivery. Ask any questions that may concern you. It is important to see your doctor regularly throughout your pregnancy. Avoid exposure to toxic substances and chemicals - such as cleaning solvents, lead and mercury, some insecticides, and paint. Pregnant women should avoid exposure to paint fumes, and fumes that cause you to feel ill, dizzy or faint. When possible, it is a good idea to have a pre-pregnancy consultation with your caregiver to begin some important recommendations your caregiver suggests such as, taking folic acid, exercising, quitting smoking, avoiding alcoholic beverages, etc. B Breastfeeding is the healthiest choice for both you and your baby. It has many nutritional benefits for the baby and health benefits for the mother. It also creates a very tight and loving bond between the baby and mother. Talk to your doctor, your family and friends, and your employer about how you choose to feed your baby and how they can support you in your decision. Not all birth defects can be prevented, but a woman can take actions that may increase her chance of having a healthy baby. Many birth defects happen very early in pregnancy, sometimes before a woman even knows she is pregnant. Birth defects or abnormalities of any child in your or the father's family should be discussed with your caregiver. Get a good support bra as your breast size changes. Wear it especially when you exercise and when nursing.  C Celebrate the news of your pregnancy with the your spouse/father and family. Childbirth classes are helpful to  take for you and the spouse/father because it helps to understand what happens during the pregnancy, labor and delivery. Cesarean delivery should be discussed with your doctor so you are prepared for that possibility. The pros and cons of circumcision if it is a boy, should be discussed with your pediatrician. Cigarette smoking during pregnancy can result in low birth weight babies. It has been associated with infertility, miscarriages, tubal pregnancies, infant death (mortality) and poor health (morbidity) in childhood. Additionally, cigarette smoking may cause long-term learning disabilities. If you smoke, you should try to quit before getting pregnant and not smoke during the pregnancy. Secondary smoke may also harm a mother and her developing baby. It is a good idea to ask people to stop smoking around you during your pregnancy and after the baby is born. Extra calcium is necessary when you are pregnant and is found in your prenatal vitamin, in dairy products, green leafy vegetables and in calcium supplements. D A healthy diet according to your current weight and height, along with vitamins and mineral supplements should be discussed with your caregiver. Domestic abuse or violence should be made known to your doctor right away to get the situation corrected. Drink more water when you exercise to keep hydrated. Discomfort of your back and legs usually develops and progresses from the middle of the second trimester through to delivery of the baby. This is because of the enlarging baby and uterus, which may also affect your balance. Do not take illegal drugs. Illegal drugs can seriously harm the baby and you. Drink extra fluids (water is best) throughout pregnancy to help  your body keep up with the increases in your blood volume. Drink at least 6 to 8 glasses of water, fruit juice, or milk each day. A good way to know you are drinking enough fluid is when your urine looks almost like clear water or is very light  yellow.  E Eat healthy to get the nutrients you and your unborn baby need. Your meals should include the five basic food groups. Exercise (30 minutes of light to moderate exercise a day) is important and encouraged during pregnancy, if there are no medical problems or problems with the pregnancy. Exercise that causes discomfort or dizziness should be stopped and reported to your caregiver. Emotions during pregnancy can change from being ecstatic to depression and should be understood by you, your partner and your family. F Fetal screening with ultrasound, amniocentesis and monitoring during pregnancy and labor is common and sometimes necessary. Take 400 micrograms of folic acid daily both before, when possible, and during the first few months of pregnancy to reduce the risk of birth defects of the brain and spine. All women who could possibly become pregnant should take a vitamin with folic acid, every day. It is also important to eat a healthy diet with fortified foods (enriched grain products, including cereals, rice, breads, and pastas) and foods with natural sources of folate (orange juice, green leafy vegetables, beans, peanuts, broccoli, asparagus, peas, and lentils). The father should be involved with all aspects of the pregnancy including, the prenatal care, childbirth classes, labor, delivery, and postpartum time. Fathers may also have emotional concerns about being a father, financial needs, and raising a family. G Genetic testing should be done appropriately. It is important to know your family and the father's history. If there have been problems with pregnancies or birth defects in your family, report these to your doctor. Also, genetic counselors can talk with you about the information you might need in making decisions about having a family. You can call a major medical center in your area for help in finding a board-certified genetic counselor. Genetic testing and counseling should be done  before pregnancy when possible, especially if there is a history of problems in the mother's or father's family. Certain ethnic backgrounds are more at risk for genetic defects. H Get familiar with the hospital where you will be having your baby. Get to know how long it takes to get there, the labor and delivery area, and the hospital procedures. Be sure your medical insurance is accepted there. Get your home ready for the baby including, clothes, the baby's room (when possible), furniture and car seat. Hand washing is important throughout the day, especially after handling raw meat and poultry, changing the baby's diaper or using the bathroom. This can help prevent the spread of many bacteria and viruses that cause infection. Your hair may become dry and thinner, but will return to normal a few weeks after the baby is born. Heartburn is a common problem that can be treated by taking antacids recommended by your caregiver, eating smaller meals 5 or 6 times a day, not drinking liquids when eating, drinking between meals and raising the head of your bed 2 to 3 inches. I Insurance to cover you, the baby, doctor and hospital should be reviewed so that you will be prepared to pay any costs not covered by your insurance plan. If you do not have medical insurance, there are usually clinics and services available for you in your community. Take 30 milligrams of iron during  your pregnancy as prescribed by your doctor to reduce the risk of low red blood cells (anemia) later in pregnancy. All women of childbearing age should eat a diet rich in iron. J There should be a joint effort for the mother, father and any other children to adapt to the pregnancy financially, emotionally, and psychologically during the pregnancy. Join a support group for moms-to-be. Or, join a class on parenting or childbirth. Have the family participate when possible. K Know your limits. Let your caregiver know if you experience any of the  following:   Pain of any kind.   Strong cramps.   You develop a lot of weight in a short period of time (5 pounds in 3 to 5 days).   Vaginal bleeding, leaking of amniotic fluid.   Headache, vision problems.   Dizziness, fainting, shortness of breath.   Chest pain.   Fever of 102 F (38.9 C) or higher.   Gush of clear fluid from your vagina.   Painful urination.   Domestic violence.   Irregular heartbeat (palpitations).   Rapid beating of the heart (tachycardia).   Constant feeling sick to your stomach (nauseous) and vomiting.   Trouble walking, fluid retention (edema).   Muscle weakness.   If your baby has decreased activity.   Persistent diarrhea.   Abnormal vaginal discharge.   Uterine contractions at 20-minute intervals.   Back pain that travels down your leg.  L Learn and practice that what you eat and drink should be in moderation and healthy for you and your baby. Legal drugs such as alcohol and caffeine are important issues for pregnant women. There is no safe amount of alcohol a woman can drink while pregnant. Fetal alcohol syndrome, a disorder characterized by growth retardation, facial abnormalities, and central nervous system dysfunction, is caused by a woman's use of alcohol during pregnancy. Caffeine, found in tea, coffee, soft drinks and chocolate, should also be limited. Be sure to read labels when trying to cut down on caffeine during pregnancy. More than 200 foods, beverages, and over-the-counter medications contain caffeine and have a high salt content! There are coffees and teas that do not contain caffeine. M Medical conditions such as diabetes, epilepsy, and high blood pressure should be treated and kept under control before pregnancy when possible, but especially during pregnancy. Ask your caregiver about any medications that may need to be changed or adjusted during pregnancy. If you are currently taking any medications, ask your caregiver if it  is safe to take them while you are pregnant or before getting pregnant when possible. Also, be sure to discuss any herbs or vitamins you are taking. They are medicines, too! Discuss with your doctor all medications, prescribed and over-the-counter, that you are taking. During your prenatal visit, discuss the medications your doctor may give you during labor and delivery. N Never be afraid to ask your doctor or caregiver questions about your health, the progress of the pregnancy, family problems, stressful situations, and recommendation for a pediatrician, if you do not have one. It is better to take all precautions and discuss any questions or concerns you may have during your office visits. It is a good idea to write down your questions before you visit the doctor. O Over-the-counter cough and cold remedies may contain alcohol or other ingredients that should be avoided during pregnancy. Ask your caregiver about prescription, herbs or over-the-counter medications that you are taking or may consider taking while pregnant.  P Physical activity during pregnancy can  benefit both you and your baby by lessening discomfort and fatigue, providing a sense of well-being, and increasing the likelihood of early recovery after delivery. Light to moderate exercise during pregnancy strengthens the belly (abdominal) and back muscles. This helps improve posture. Practicing yoga, walking, swimming, and cycling on a stationary bicycle are usually safe exercises for pregnant women. Avoid scuba diving, exercise at high altitudes (over 3000 feet), skiing, horseback riding, contact sports, etc. Always check with your doctor before beginning any kind of exercise, especially during pregnancy and especially if you did not exercise before getting pregnant. Q Queasiness, stomach upset and morning sickness are common during pregnancy. Eating a couple of crackers or dry toast before getting out of bed. Foods that you normally love may  make you feel sick to your stomach. You may need to substitute other nutritious foods. Eating 5 or 6 small meals a day instead of 3 large ones may make you feel better. Do not drink with your meals, drink between meals. Questions that you have should be written down and asked during your prenatal visits. R Read about and make plans to baby-proof your home. There are important tips for making your home a safer environment for your baby. Review the tips and make your home safer for you and your baby. Read food labels regarding calories, salt and fat content in the food. S Saunas, hot tubs, and steam rooms should be avoided while you are pregnant. Excessive high heat may be harmful during your pregnancy. Your caregiver will screen and examine you for sexually transmitted diseases and genetic disorders during your prenatal visits. Learn the signs of labor. Sexual relations while pregnant is safe unless there is a medical or pregnancy problem and your caregiver advises against it. T Traveling long distances should be avoided especially in the third trimester of your pregnancy. If you do have to travel out of state, be sure to take a copy of your medical records and medical insurance plan with you. You should not travel long distances without seeing your doctor first. Most airlines will not allow you to travel after 36 weeks of pregnancy. Toxoplasmosis is an infection caused by a parasite that can seriously harm an unborn baby. Avoid eating undercooked meat and handling cat litter. Be sure to wear gloves when gardening. Tingling of the hands and fingers is not unusual and is due to fluid retention. This will go away after the baby is born. U Womb (uterus) size increases during the first trimester. Your kidneys will begin to function more efficiently. This may cause you to feel the need to urinate more often. You may also leak urine when sneezing, coughing or laughing. This is due to the growing uterus pressing  against your bladder, which lies directly in front of and slightly under the uterus during the first few months of pregnancy. If you experience burning along with frequency of urination or bloody urine, be sure to tell your doctor. The size of your uterus in the third trimester may cause a problem with your balance. It is advisable to maintain good posture and avoid wearing high heels during this time. An ultrasound of your baby may be necessary during your pregnancy and is safe for you and your baby. V Vaccinations are an important concern for pregnant women. Get needed vaccines before pregnancy. Center for Disease Control (http://www.wolf.info/) has clear guidelines for the use of vaccines during pregnancy. Review the list, be sure to discuss it with your doctor. Prenatal vitamins are helpful  and healthy for you and the baby. Do not take extra vitamins except what is recommended. Taking too much of certain vitamins can cause overdose problems. Continuous vomiting should be reported to your caregiver. Varicose veins may appear especially if there is a family history of varicose veins. They should subside after the delivery of the baby. Support hose helps if there is leg discomfort. W Being overweight or underweight during pregnancy may cause problems. Try to get within 15 pounds of your ideal weight before pregnancy. Remember, pregnancy is not a time to be dieting! Do not stop eating or start skipping meals as your weight increases. Both you and your baby need the calories and nutrition you receive from a healthy diet. Be sure to consult with your doctor about your diet. There is a formula and diet plan available depending on whether you are overweight or underweight. Your caregiver or nutritionist can help and advise you if necessary. X Avoid X-rays. If you must have dental work or diagnostic tests, tell your dentist or physician that you are pregnant so that extra care can be taken. X-rays should only be taken when  the risks of not taking them outweigh the risk of taking them. If needed, only the minimum amount of radiation should be used. When X-rays are necessary, protective lead shields should be used to cover areas of the body that are not being X-rayed. Y Your baby loves you. Breastfeeding your baby creates a loving and very close bond between the two of you. Give your baby a healthy environment to live in while you are pregnant. Infants and children require constant care and guidance. Their health and safety should be carefully watched at all times. After the baby is born, rest or take a nap when the baby is sleeping. Z Get your ZZZs. Be sure to get plenty of rest. Resting on your side as often as possible, especially on your left side is advised. It provides the best circulation to your baby and helps reduce swelling. Try taking a nap for 30 to 45 minutes in the afternoon when possible. After the baby is born rest or take a nap when the baby is sleeping. Try elevating your feet for that amount of time when possible. It helps the circulation in your legs and helps reduce swelling.  Most information courtesy of the CDC. Document Released: 07/03/2005 Document Revised: 06/22/2011 Document Reviewed: 03/17/2009 [facility] Patient Information 2012 [place], [place]. Hyperemesis Gravidarum Hyperemesis gravidarum is a severe form of nausea and vomiting that happens during pregnancy. Hyperemesis is worse than morning sickness. It may cause a woman to have nausea or vomiting all day for many days. It may keep a woman from eating and drinking enough food and liquids. Hyperemesis usually occurs during the first half (the first 20 weeks) of pregnancy. It often goes away once a woman is in her second half of pregnancy. However, sometimes hyperemesis continues through an entire pregnancy.  CAUSES  The cause of this condition is not completely known but is thought to be due to changes in the body's hormones when pregnant. It  could be the high level of the pregnancy hormone or an increase in estrogen in the body.  SYMPTOMS   Severe nausea and vomiting.   Nausea that does not go away.   Vomiting that does not allow you to keep any food down.   Weight loss and body fluid loss (dehydration).   Having no desire to eat or not liking food you have previously  enjoyed.  DIAGNOSIS  Your caregiver may ask you about your symptoms. Your caregiver may also order blood tests and urine tests to make sure something else is not causing the problem.  TREATMENT  You may only need medicine to control the problem. If medicines do not control the nausea and vomiting, you will be treated in the hospital to prevent dehydration, acidosis, weight loss, and changes in the electrolytes in your body that may harm the unborn baby (fetus). You may need intravenous (IV) fluids.  HOME CARE INSTRUCTIONS   Take all medicine as directed by your caregiver.   Try eating a couple of dry crackers or toast in the morning before getting out of bed.   Avoid foods and smells that upset your stomach.   Avoid fatty and spicy foods. Eat 5 to 6 small meals a day.   Do not drink when eating meals. Drink between meals.   For snacks, eat high protein foods, such as cheese. Eat or suck on things that have ginger in them. Ginger helps nausea.   Avoid food preparation. The smell of food can spoil your appetite.   Avoid iron pills and iron in your multivitamins until after 3 to 4 months of being pregnant.  SEEK MEDICAL CARE IF:   Your abdominal pain increases since the last time you saw your caregiver.   You have a severe headache.   You develop vision problems.   You feel you are losing weight.  SEEK IMMEDIATE MEDICAL CARE IF:   You are unable to keep fluids down.   You vomit blood.   You have constant nausea and vomiting.   You have a fever.   You have excessive weakness, dizziness, fainting, or extreme thirst.  MAKE SURE YOU:    Understand these instructions.   Will watch your condition.   Will get help right away if you are not doing well or get worse.  Document Released: 07/03/2005 Document Revised: 06/22/2011 Document Reviewed: 10/03/2010 [facility] Patient Information 2012 [place], [place].

## 2011-12-03 NOTE — MAU Note (Signed)
Patient presents with c/o cramping and vomiting since two days [redacted]w[redacted]d

## 2011-12-03 NOTE — Progress Notes (Addendum)
[name] [name] is a 21 y.o. female presenting for c/o fo lost 10# with this pg, vomiting all the time, vomit x 10 in past 24 hours, no vag bleeding, feeling crampy and points to entire abdomen, scheduled to start [facility] with [facility] on 5/30 @[facility] @ OB History    Grav Para Term Preterm Abortions TAB SAB Ect Mult Living   2 1  1      1      Past Medical History  Diagnosis Date  . History of hypertension no meds  . Left ureteral calculus   . Frequency of urination   . Urgency of urination   . Nocturia   . ADHD (attention deficit hyperactivity disorder)   . Bipolar 1 disorder no meds  . Hypertension    Past Surgical History  Procedure Date  . Wisdom tooth extraction 2009- oral md office  . Cystoscopy/retrograde/ureteroscopy 08/25/2011    Procedure: CYSTOSCOPY/RETROGRADE/URETEROSCOPY;  Surgeon: Antony Haste, MD;  Location: [facility];  Service: Urology;  Laterality: Left;  cystoscopy LEFT retrograde pyelogram LEFT URETEROSCOPY holmium  LASER LITHO AND BASKET EXTRACTION C-ARM LASER   Family History: family history is not on file. Social History:  reports that she quit smoking about 3 weeks ago. She has never used smokeless tobacco. She reports that she does not drink alcohol or use illicit drugs.  @ROS @ Calm, no distress, HEENT WNL,  lungs clear bilaterally, AP RRR, abd soft nt, no masses, not tympanic bowel sounds active, abdomen nontender, Normal hair distrubition mons pubis,  EGBUS WNL,  LTC, no cervical motion tenderness, No adnexal masses or tenderness no edema to lower extremities   Blood pressure 121/73, pulse 66, temperature 98.9 F (37.2 C), temperature source Oral, resp. rate 16, height 5\' 7"  (1.702 m), weight 195 lb 12.8 oz (88.814 kg), last menstrual period 10/11/2011. Results for orders placed during the hospital encounter of 12/03/11 (from the past 24 hour(s))  URINALYSIS, ROUTINE W REFLEX MICROSCOPIC     Status: Abnormal   Collection Time   12/03/11 11:20 AM      Component Value Range   Color, Urine YELLOW  YELLOW    APPearance HAZY (*) CLEAR    Specific Gravity, Urine 1.015  1.005 - 1.030    pH 7.5  5.0 - 8.0    Glucose, UA NEGATIVE  NEGATIVE (mg/dL)   Hgb urine dipstick NEGATIVE  NEGATIVE    Bilirubin Urine NEGATIVE  NEGATIVE    Ketones, ur NEGATIVE  NEGATIVE (mg/dL)   Protein, ur NEGATIVE  NEGATIVE (mg/dL)   Urobilinogen, UA 0.2  0.0 - 1.0 (mg/dL)   Nitrite NEGATIVE  NEGATIVE    Leukocytes, UA TRACE (*) NEGATIVE   URINE MICROSCOPIC-ADD ON     Status: Abnormal   Collection Time   12/03/11 11:20 AM      Component Value Range   Squamous Epithelial / LPF FEW (*) RARE    WBC, UA 3-6  <3 (WBC/hpf)   Bacteria, UA RARE  RARE   POCT PREGNANCY, URINE     Status: Abnormal   Collection Time   12/03/11 11:27 AM      Component Value Range   Preg Test, Ur POSITIVE (*) NEGATIVE    Assessment/Plan: 7 4/7 week IUP No ketonuria Plan IV hydration, zofran. Discussed nutrition, water, RX zofran, phenergan, f/o office as scheduled, report if symptoms change or worsen.   Gennaro Lizotte 12/03/2011, 1:40 PM  Tolerates po fluids and crackers, discharge Lavera Guise, CNM

## 2011-12-14 ENCOUNTER — Ambulatory Visit (INDEPENDENT_AMBULATORY_CARE_PROVIDER_SITE_OTHER): Payer: Medicaid Other | Admitting: Obstetrics and Gynecology

## 2011-12-14 DIAGNOSIS — Z331 Pregnant state, incidental: Secondary | ICD-10-CM

## 2011-12-14 LAB — POCT URINALYSIS DIPSTICK
Bilirubin, UA: NEGATIVE
Ketones, UA: NEGATIVE

## 2011-12-14 NOTE — Progress Notes (Signed)
PT SCHEDULED FOR FIRST TRIMESTER SCREEN ON SAME DAY AS NOB WORK UP.

## 2011-12-15 LAB — PRENATAL PANEL VII
HIV: NONREACTIVE
Hemoglobin: 13.5 g/dL (ref 12.0–15.0)
Hepatitis B Surface Ag: NEGATIVE
Lymphs Abs: 2.3 10*3/uL (ref 0.7–4.0)
MCH: 30.8 pg (ref 26.0–34.0)
Monocytes Relative: 5 % (ref 3–12)
Neutro Abs: 8.9 10*3/uL — ABNORMAL HIGH (ref 1.7–7.7)
Neutrophils Relative %: 75 % (ref 43–77)
RBC: 4.38 MIL/uL (ref 3.87–5.11)
Rubella: 32.7 IU/mL — ABNORMAL HIGH

## 2011-12-22 ENCOUNTER — Other Ambulatory Visit: Payer: Self-pay

## 2011-12-22 DIAGNOSIS — Z331 Pregnant state, incidental: Secondary | ICD-10-CM

## 2011-12-28 ENCOUNTER — Ambulatory Visit (INDEPENDENT_AMBULATORY_CARE_PROVIDER_SITE_OTHER): Payer: Medicaid Other

## 2011-12-28 ENCOUNTER — Encounter (HOSPITAL_COMMUNITY): Payer: Self-pay | Admitting: Pharmacist

## 2011-12-28 ENCOUNTER — Ambulatory Visit (INDEPENDENT_AMBULATORY_CARE_PROVIDER_SITE_OTHER): Payer: Medicaid Other | Admitting: Obstetrics and Gynecology

## 2011-12-28 ENCOUNTER — Encounter: Payer: Medicaid Other | Admitting: Obstetrics and Gynecology

## 2011-12-28 DIAGNOSIS — O021 Missed abortion: Secondary | ICD-10-CM | POA: Insufficient documentation

## 2011-12-28 DIAGNOSIS — E669 Obesity, unspecified: Secondary | ICD-10-CM

## 2011-12-28 DIAGNOSIS — B009 Herpesviral infection, unspecified: Secondary | ICD-10-CM

## 2011-12-28 DIAGNOSIS — Z331 Pregnant state, incidental: Secondary | ICD-10-CM

## 2011-12-28 HISTORY — DX: Missed abortion: O02.1

## 2011-12-28 LAB — US OB COMP ADDL GEST LESS 14 WKS

## 2011-12-28 NOTE — Progress Notes (Signed)
HISTORY OF PRESENT ILLNESS  [name] is a 21 y.o. year old female,G2P0101, who presents for a problem visit. The patient was thought to be [redacted] weeks pregnant.  Her blood type is A+.  Subjective:  The patient has had some cramping this morning.  She denies vaginal bleeding.  Objective:  LMP 10/11/2011   General: emotionally distraught about her ultrasound report Resp: clear to auscultation bilaterally Cardio: regular rate and rhythm, S1, S2 normal, no murmur, click, rub or gallop GI: soft, non-tender; bowel sounds normal; no masses,  no organomegaly  Exam deferred.  The patient was to upset to allow on exam (crying, spitting up, writhing in agony).  Ultrasound: Nonviable gestation at 8 weeks.  Assessment:  First trimester missed abortion  Plan:  Management options reviewed with the patient.  I recommended that the patient talk with the father of her baby and then call us with her decision.  The patient called back to the office in requests a dilatation and evacuation tomorrow.  She will remain n.p.o. After midnight.  Leonard Schwartz M.D.  12/28/2011 9:16 PM

## 2011-12-29 ENCOUNTER — Ambulatory Visit (HOSPITAL_COMMUNITY): Payer: Medicaid Other | Admitting: Anesthesiology

## 2011-12-29 ENCOUNTER — Encounter (HOSPITAL_COMMUNITY)
Admission: RE | Disposition: A | Discharge status: Home / Self Care | Payer: Self-pay | Source: Ambulatory Visit | Attending: Obstetrics and Gynecology

## 2011-12-29 ENCOUNTER — Encounter (HOSPITAL_COMMUNITY): Payer: Self-pay | Admitting: Anesthesiology

## 2011-12-29 ENCOUNTER — Ambulatory Visit (HOSPITAL_COMMUNITY): Payer: Medicaid Other

## 2011-12-29 ENCOUNTER — Ambulatory Visit (HOSPITAL_COMMUNITY)
Admission: RE | Admit: 2011-12-29 | Discharge: 2011-12-29 | Disposition: A | Discharge status: Home / Self Care | Payer: Medicaid Other | Source: Ambulatory Visit | Attending: Obstetrics and Gynecology | Admitting: Obstetrics and Gynecology

## 2011-12-29 ENCOUNTER — Encounter (HOSPITAL_COMMUNITY): Payer: Self-pay | Admitting: *Deleted

## 2011-12-29 DIAGNOSIS — O021 Missed abortion: Secondary | ICD-10-CM

## 2011-12-29 HISTORY — PX: DILATION AND EVACUATION: SHX1459

## 2011-12-29 HISTORY — DX: Unspecified pre-existing hypertension complicating pregnancy, unspecified trimester: O10.919

## 2011-12-29 LAB — CBC
HCT: 39.9 % (ref 36.0–46.0)
Hemoglobin: 13.6 g/dL (ref 12.0–15.0)
MCV: 92.1 fL (ref 78.0–100.0)
WBC: 8.3 10*3/uL (ref 4.0–10.5)

## 2011-12-29 SURGERY — DILATION AND EVACUATION, UTERUS
Anesthesia: Monitor Anesthesia Care | Site: Vagina | Wound class: Clean Contaminated

## 2011-12-29 MED ORDER — PROPOFOL 10 MG/ML IV EMUL
INTRAVENOUS | Status: AC
  Filled 2011-12-29: qty 20

## 2011-12-29 MED ORDER — MIDAZOLAM HCL 5 MG/5ML IJ SOLN
INTRAMUSCULAR | Status: DC | PRN
  Administered 2011-12-29: 2 mg via INTRAVENOUS

## 2011-12-29 MED ORDER — PROPOFOL 10 MG/ML IV EMUL
INTRAVENOUS | Status: DC | PRN
  Administered 2011-12-29: 50 mg via INTRAVENOUS
  Administered 2011-12-29: 60 mg via INTRAVENOUS
  Administered 2011-12-29: 40 mg via INTRAVENOUS
  Administered 2011-12-29 (×3): 50 mg via INTRAVENOUS

## 2011-12-29 MED ORDER — LIDOCAINE HCL 2 % IJ SOLN
INTRAMUSCULAR | Status: AC
  Filled 2011-12-29: qty 1

## 2011-12-29 MED ORDER — FENTANYL CITRATE 0.05 MG/ML IJ SOLN
25.0000 ug | INTRAMUSCULAR | Status: DC | PRN
  Administered 2011-12-29 (×2): 50 ug via INTRAVENOUS

## 2011-12-29 MED ORDER — KETOROLAC TROMETHAMINE 60 MG/2ML IM SOLN
INTRAMUSCULAR | Status: AC
  Filled 2011-12-29: qty 2

## 2011-12-29 MED ORDER — KETOROLAC TROMETHAMINE 60 MG/2ML IM SOLN
INTRAMUSCULAR | Status: DC | PRN
  Administered 2011-12-29: 30 mg via INTRAMUSCULAR

## 2011-12-29 MED ORDER — ONDANSETRON HCL 4 MG/2ML IJ SOLN
INTRAMUSCULAR | Status: AC
  Filled 2011-12-29: qty 2

## 2011-12-29 MED ORDER — FENTANYL CITRATE 0.05 MG/ML IJ SOLN
INTRAMUSCULAR | Status: DC | PRN
  Administered 2011-12-29: 50 ug via INTRAVENOUS
  Administered 2011-12-29: 100 ug via INTRAVENOUS
  Administered 2011-12-29: 50 ug via INTRAVENOUS

## 2011-12-29 MED ORDER — HYDROCODONE-ACETAMINOPHEN 5-500 MG PO TABS: 1.0000 | ORAL_TABLET | Freq: Four times a day (QID) | ORAL | Status: DC | PRN

## 2011-12-29 MED ORDER — FENTANYL CITRATE 0.05 MG/ML IJ SOLN
INTRAMUSCULAR | Status: AC
  Filled 2011-12-29: qty 2

## 2011-12-29 MED ORDER — LIDOCAINE HCL 2 % IJ SOLN
INTRAMUSCULAR | Status: DC | PRN
  Administered 2011-12-29: 20 mL

## 2011-12-29 MED ORDER — DEXAMETHASONE SODIUM PHOSPHATE 10 MG/ML IJ SOLN
INTRAMUSCULAR | Status: AC
  Filled 2011-12-29: qty 1

## 2011-12-29 MED ORDER — ONDANSETRON HCL 4 MG/2ML IJ SOLN
INTRAMUSCULAR | Status: DC | PRN
  Administered 2011-12-29: 4 mg via INTRAVENOUS

## 2011-12-29 MED ORDER — HYDROCODONE-ACETAMINOPHEN 5-500 MG PO TABS: 1.0000 | ORAL_TABLET | Freq: Four times a day (QID) | ORAL | Status: AC | PRN

## 2011-12-29 MED ORDER — LIDOCAINE HCL (CARDIAC) 20 MG/ML IV SOLN
INTRAVENOUS | Status: DC | PRN
  Administered 2011-12-29: 60 mg via INTRAVENOUS

## 2011-12-29 MED ORDER — DEXAMETHASONE SODIUM PHOSPHATE 10 MG/ML IJ SOLN
INTRAMUSCULAR | Status: DC | PRN
  Administered 2011-12-29: 10 mg via INTRAVENOUS

## 2011-12-29 MED ORDER — LACTATED RINGERS IV SOLN
INTRAVENOUS | Status: DC
  Administered 2011-12-29 (×2): via INTRAVENOUS

## 2011-12-29 MED ORDER — FENTANYL CITRATE 0.05 MG/ML IJ SOLN
INTRAMUSCULAR | Status: AC
  Administered 2011-12-29: 50 ug via INTRAVENOUS
  Filled 2011-12-29: qty 2

## 2011-12-29 MED ORDER — 0.9 % SODIUM CHLORIDE (POUR BTL) OPTIME
TOPICAL | Status: DC | PRN
  Administered 2011-12-29: 1000 mL

## 2011-12-29 MED ORDER — KETOROLAC TROMETHAMINE 30 MG/ML IJ SOLN
INTRAMUSCULAR | Status: AC
  Filled 2011-12-29: qty 1

## 2011-12-29 MED ORDER — LIDOCAINE HCL (CARDIAC) 20 MG/ML IV SOLN
INTRAVENOUS | Status: AC
  Filled 2011-12-29: qty 5

## 2011-12-29 MED ORDER — MIDAZOLAM HCL 2 MG/2ML IJ SOLN
INTRAMUSCULAR | Status: AC
  Filled 2011-12-29: qty 2

## 2011-12-29 MED ORDER — KETOROLAC TROMETHAMINE 30 MG/ML IJ SOLN
INTRAMUSCULAR | Status: DC | PRN
  Administered 2011-12-29: 30 mg via INTRAVENOUS

## 2011-12-29 MED ORDER — IBUPROFEN 600 MG PO TABS: 600.0000 mg | ORAL_TABLET | Freq: Four times a day (QID) | ORAL | Status: AC | PRN

## 2011-12-29 SURGICAL SUPPLY — 21 items
CATH ROBINSON RED A/P 16FR (CATHETERS) ×2 IMPLANT
CLOTH BEACON ORANGE TIMEOUT ST (SAFETY) ×2 IMPLANT
DECANTER SPIKE VIAL GLASS SM (MISCELLANEOUS) ×2 IMPLANT
GLOVE BIO SURGEON STRL SZ7.5 (GLOVE) ×4 IMPLANT
GLOVE BIOGEL PI IND STRL 7.5 (GLOVE) ×1 IMPLANT
GLOVE BIOGEL PI INDICATOR 7.5 (GLOVE) ×1
GOWN PREVENTION PLUS LG XLONG (DISPOSABLE) ×2 IMPLANT
KIT BERKELEY 1ST TRIMESTER 3/8 (MISCELLANEOUS) ×2 IMPLANT
NDL SPNL 22GX3.5 QUINCKE BK (NEEDLE) ×1 IMPLANT
NEEDLE SPNL 22GX3.5 QUINCKE BK (NEEDLE) ×2 IMPLANT
NS IRRIG 1000ML POUR BTL (IV SOLUTION) ×2 IMPLANT
PACK VAGINAL MINOR WOMEN LF (CUSTOM PROCEDURE TRAY) ×2 IMPLANT
PAD PREP 24X48 CUFFED NSTRL (MISCELLANEOUS) ×2 IMPLANT
SET BERKELEY SUCTION TUBING (SUCTIONS) ×2 IMPLANT
SYR CONTROL 10ML LL (SYRINGE) ×2 IMPLANT
TOWEL OR 17X24 6PK STRL BLUE (TOWEL DISPOSABLE) ×4 IMPLANT
VACURETTE 10 RIGID CVD (CANNULA) IMPLANT
VACURETTE 7MM CVD STRL WRAP (CANNULA) IMPLANT
VACURETTE 8 RIGID CVD (CANNULA) ×1 IMPLANT
VACURETTE 9 RIGID CVD (CANNULA) IMPLANT
WATER STERILE IRR 1000ML POUR (IV SOLUTION) ×2 IMPLANT

## 2011-12-29 NOTE — Discharge Instructions (Signed)

## 2011-12-29 NOTE — Transfer of Care (Signed)
Immediate Anesthesia Transfer of Care Note  Patient: [name] [name]  Procedure(s) Performed: Procedure(s) (LRB): DILATATION AND EVACUATION (N/A)  Patient Location: PACU  Anesthesia Type: MAC  Level of Consciousness: awake, alert  and oriented  Airway & Oxygen Therapy: Patient Spontanous Breathing  Post-op Assessment: Report given to PACU RN and Post -op Vital signs reviewed and stable  Post vital signs: Reviewed and stable  Complications: No apparent anesthesia complications

## 2011-12-29 NOTE — Anesthesia Postprocedure Evaluation (Signed)
Anesthesia Post Note  Patient: [name] [name]  Procedure(s) Performed: Procedure(s) (LRB): DILATATION AND EVACUATION (N/A)  Anesthesia type: MAC  Patient location: PACU  Post pain: Pain level controlled  Post assessment: Post-op Vital signs reviewed  Last Vitals:  Filed Vitals:   12/29/11 1430  BP: 121/79  Pulse: 66  Temp:   Resp: 18    Post vital signs: Reviewed  Level of consciousness: sedated  Complications: No apparent anesthesia complications

## 2011-12-29 NOTE — H&P (Signed)
Lizabeth Fellner is a 21 y.o. female presenting for D&E due to missed AB diagnosed at office visit yesterday--patient was there for 1st trimester screen at 11 weeks, but was found to be 8 weeks by [place] with fetal demise.  She elected to proceed with D&E today.  Hx remarkable for: Patient Active Problem List  Diagnosis  . Missed abortion  . Obesity  . HSV-1 (herpes simplex virus 1) infection  Additions to problem list: Bipolar disorder ADHD Mild schizophrenia Hx hypertension, but no medication Hx kidney stone .  OB History    Grav Para Term Preterm Abortions TAB SAB Ect Mult Living   2 1  1      1      Past Medical History  Diagnosis Date  . History of hypertension no meds  . Left ureteral calculus   . Frequency of urination   . Urgency of urination   . Nocturia   . ADHD (attention deficit hyperactivity disorder)   . Depression   . Bipolar 1 disorder   . Anxiety   . Schizophrenia     MILD  . Hypertension     HX OF HTN;NO MEDS   Past Surgical History  Procedure Date  . Wisdom tooth extraction 2009- oral md office  . Cystoscopy/retrograde/ureteroscopy 08/25/2011    Procedure: CYSTOSCOPY/RETROGRADE/URETEROSCOPY;  Surgeon: Antony Haste, MD;  Location: [facility];  Service: Urology;  Laterality: Left;  cystoscopy LEFT retrograde pyelogram LEFT URETEROSCOPY holmium  LASER LITHO AND BASKET EXTRACTION C-ARM LASER  . Kidney stone surgery 07/2011    9 MM   Family History: family history includes Anxiety disorder in her mother; Diabetes in her maternal grandfather; Heart disease in her maternal grandmother and paternal grandfather; Hypertension in her maternal grandmother; and Other in her mother.  Social History:  reports that she quit smoking about 6 weeks ago. Her smoking use included Cigarettes. She has never used smokeless tobacco. She reports that she drinks alcohol. She reports that she does not use illicit drugs.  ROS:  Denies any symptoms, other than  emotional distress due to loss.    Last menstrual period 10/11/2011. Exam by Dr. Stefano Gaul yesterday: Chest clear Heart RRR without murmur Abd soft, NT Pelvic--deferred Ext WNL    Prenatal labs: ABO, Rh: A/POS/-- (05/30 1548) Antibody: NEG (05/30 1548) Rubella:  Immune RPR: NON REAC (05/30 1548)  HBsAg: NEGATIVE (05/30 1548)  HIV: NON REACTIVE (05/30 1548)  GBS:   NA  Assessment/Plan: 1st trimester missed AB  Plan: D&E scheduled today with Dr. Su Hilt. Support to patient for loss.  Nigel Bridgeman 12/29/2011, 8:42 AM  Agree with above.  R/B/A discussed.  Pt requested repeat u/s.  Verbal report c/w Missed Ab per Judeth Cornfield, RN in Day Surgery.  Pt wants to proceed with D&E/Suction D&C. - AYR

## 2011-12-29 NOTE — Anesthesia Preprocedure Evaluation (Addendum)
Anesthesia Evaluation  Patient identified by MRN, date of birth, ID band Patient awake    Reviewed: Allergy & Precautions, H&P , Patient's Chart, lab work & pertinent test results, reviewed documented beta blocker date and time   Airway Mallampati: II TM Distance: >3 FB Neck ROM: full    Dental No notable dental hx.    Pulmonary  breath sounds clear to auscultation  Pulmonary exam normal       Cardiovascular Rhythm:regular Rate:Normal     Neuro/Psych    GI/Hepatic   Endo/Other    Renal/GU      Musculoskeletal   Abdominal   Peds  Hematology   Anesthesia Other Findings Multiple piercings.......to be removed  Reproductive/Obstetrics                           Anesthesia Physical Anesthesia Plan  ASA: II  Anesthesia Plan: General and MAC   Post-op Pain Management:    Induction: Intravenous  Airway Management Planned: LMA, Mask and Natural Airway  Additional Equipment:   Intra-op Plan:   Post-operative Plan:   Informed Consent: I have reviewed the patients History and Physical, chart, labs and discussed the procedure including the risks, benefits and alternatives for the proposed anesthesia with the patient or authorized representative who has indicated his/her understanding and acceptance.   Dental Advisory Given  Plan Discussed with: CRNA and Surgeon  Anesthesia Plan Comments: (  Discussed MAC vs general anesthesia, including possible nausea, instrumentation of airway, sore throat,pulmonary aspiration, etc. I asked if the were any outstanding questions, or  concerns before we proceeded. )        Anesthesia Quick Evaluation

## 2011-12-29 NOTE — Op Note (Signed)
Preop Diagnosis: missed abortion   Postop Diagnosis: missed abortion   Procedure: DILATATION AND EVACUATION / SUCTION D&C  Anesthesia: Monitor Anesthesia Care   Anesthesiologist: Cristela Blue, MD   Attending: Purcell Nails, MD   Assistant: N/a  Findings: Mod POCs  Pathology: POCs  Fluids: 1200 cc  UOP: QS via straight cath prior to procedure  EBL: Minimal  Complications: None  Procedure: The patient was taken to the operating room after the risks benefits and alternatives were discussed with the patient, the patient verbalized understanding and consent signed and witnessed.  The patient was placed under MAC anesthesia, prepped and draped in the normal sterile fashion and a time out was performed.  A bivalve speculum was placed in the patient's vagina and the anterior lip of the cervix grasped with a single-tooth tenaculum. A paracervical block was administered using a total of 10 cc of 2% lidocaine.  The uterus was sounded to 11 cm and a size 8 suction curette was used. Suction curettage was performed until minimal tissue returned. Sharp curettage was performed until a gritty texture was noted. Suction curettage was performed once again to remove any remaining debris. All instruments were removed. The count was correct. The patient tolerated procedure well and was transferred to the recovery room in good condition.

## 2012-01-01 ENCOUNTER — Encounter (HOSPITAL_COMMUNITY): Payer: Self-pay | Admitting: Obstetrics and Gynecology

## 2012-01-12 ENCOUNTER — Encounter: Payer: Medicaid Other | Admitting: Obstetrics and Gynecology

## 2012-02-27 ENCOUNTER — Emergency Department (HOSPITAL_COMMUNITY)
Admission: EM | Admit: 2012-02-27 | Discharge: 2012-02-27 | Discharge status: Against Medical Advice | Payer: Medicaid Other | Attending: Emergency Medicine | Admitting: Emergency Medicine

## 2012-02-27 ENCOUNTER — Encounter (HOSPITAL_COMMUNITY): Payer: Self-pay | Admitting: *Deleted

## 2012-02-27 DIAGNOSIS — K920 Hematemesis: Secondary | ICD-10-CM | POA: Insufficient documentation

## 2012-02-27 DIAGNOSIS — R319 Hematuria, unspecified: Secondary | ICD-10-CM | POA: Insufficient documentation

## 2012-02-27 DIAGNOSIS — R109 Unspecified abdominal pain: Secondary | ICD-10-CM | POA: Insufficient documentation

## 2012-02-27 LAB — URINALYSIS, ROUTINE W REFLEX MICROSCOPIC
Bilirubin Urine: NEGATIVE
Glucose, UA: NEGATIVE mg/dL
Protein, ur: NEGATIVE mg/dL

## 2012-02-27 LAB — URINE MICROSCOPIC-ADD ON

## 2012-02-27 LAB — POCT I-STAT, CHEM 8
BUN: 9 mg/dL (ref 6–23)
Creatinine, Ser: 1.1 mg/dL (ref 0.50–1.10)
Sodium: 144 mEq/L (ref 135–145)
TCO2: 23 mmol/L (ref 0–100)

## 2012-02-27 LAB — POCT PREGNANCY, URINE: Preg Test, Ur: NEGATIVE

## 2012-02-27 NOTE — ED Notes (Signed)
Pt is here with right flank pain and lower right abdominal pain.  Vomiting on Thursday, back and abdominal pain on Friday with development of hematuria.  Pt reports vomiting blood on Saturday.  Pt states vomited some blood this am

## 2012-08-04 ENCOUNTER — Encounter (HOSPITAL_COMMUNITY): Payer: Self-pay | Admitting: Family Medicine

## 2012-08-04 ENCOUNTER — Emergency Department (HOSPITAL_COMMUNITY)
Admission: EM | Admit: 2012-08-04 | Discharge: 2012-08-04 | Disposition: A | Discharge status: Home / Self Care | Payer: Medicaid Other | Attending: Emergency Medicine | Admitting: Emergency Medicine

## 2012-08-04 ENCOUNTER — Emergency Department (HOSPITAL_COMMUNITY): Payer: Medicaid Other

## 2012-08-04 DIAGNOSIS — Z79899 Other long term (current) drug therapy: Secondary | ICD-10-CM | POA: Insufficient documentation

## 2012-08-04 DIAGNOSIS — F3289 Other specified depressive episodes: Secondary | ICD-10-CM | POA: Insufficient documentation

## 2012-08-04 DIAGNOSIS — F209 Schizophrenia, unspecified: Secondary | ICD-10-CM | POA: Insufficient documentation

## 2012-08-04 DIAGNOSIS — Z791 Long term (current) use of non-steroidal anti-inflammatories (NSAID): Secondary | ICD-10-CM | POA: Insufficient documentation

## 2012-08-04 DIAGNOSIS — R1310 Dysphagia, unspecified: Secondary | ICD-10-CM | POA: Insufficient documentation

## 2012-08-04 DIAGNOSIS — F319 Bipolar disorder, unspecified: Secondary | ICD-10-CM | POA: Insufficient documentation

## 2012-08-04 DIAGNOSIS — J029 Acute pharyngitis, unspecified: Secondary | ICD-10-CM | POA: Insufficient documentation

## 2012-08-04 DIAGNOSIS — R319 Hematuria, unspecified: Secondary | ICD-10-CM | POA: Insufficient documentation

## 2012-08-04 DIAGNOSIS — F411 Generalized anxiety disorder: Secondary | ICD-10-CM | POA: Insufficient documentation

## 2012-08-04 DIAGNOSIS — F329 Major depressive disorder, single episode, unspecified: Secondary | ICD-10-CM | POA: Insufficient documentation

## 2012-08-04 DIAGNOSIS — N200 Calculus of kidney: Secondary | ICD-10-CM | POA: Insufficient documentation

## 2012-08-04 DIAGNOSIS — F909 Attention-deficit hyperactivity disorder, unspecified type: Secondary | ICD-10-CM | POA: Insufficient documentation

## 2012-08-04 DIAGNOSIS — Z3202 Encounter for pregnancy test, result negative: Secondary | ICD-10-CM | POA: Insufficient documentation

## 2012-08-04 DIAGNOSIS — R109 Unspecified abdominal pain: Secondary | ICD-10-CM | POA: Insufficient documentation

## 2012-08-04 DIAGNOSIS — I10 Essential (primary) hypertension: Secondary | ICD-10-CM | POA: Insufficient documentation

## 2012-08-04 DIAGNOSIS — Z87891 Personal history of nicotine dependence: Secondary | ICD-10-CM | POA: Insufficient documentation

## 2012-08-04 DIAGNOSIS — Z8619 Personal history of other infectious and parasitic diseases: Secondary | ICD-10-CM | POA: Insufficient documentation

## 2012-08-04 DIAGNOSIS — J020 Streptococcal pharyngitis: Secondary | ICD-10-CM

## 2012-08-04 LAB — URINALYSIS, ROUTINE W REFLEX MICROSCOPIC
Bilirubin Urine: NEGATIVE
Protein, ur: 30 mg/dL — AB
Urobilinogen, UA: 0.2 mg/dL (ref 0.0–1.0)

## 2012-08-04 LAB — URINE MICROSCOPIC-ADD ON

## 2012-08-04 MED ORDER — PREDNISONE 20 MG PO TABS
60.0000 mg | ORAL_TABLET | Freq: Once | ORAL | Status: AC
  Administered 2012-08-04: 60 mg via ORAL
  Filled 2012-08-04: qty 3

## 2012-08-04 MED ORDER — PENICILLIN G BENZATHINE 1200000 UNIT/2ML IM SUSP
1.2000 10*6.[IU] | Freq: Once | INTRAMUSCULAR | Status: AC
  Administered 2012-08-04: 1.2 10*6.[IU] via INTRAMUSCULAR
  Filled 2012-08-04: qty 2

## 2012-08-04 NOTE — ED Provider Notes (Signed)
MSE was initiated and I personally evaluated the patient and placed orders (if any) at  3:30 PM on August 04, 2012.  The patient appears stable so that the remainder of the MSE may be completed by another provider.  S- 21 y/ female presents with cc sore throat and tonsilar swelling and "kidney stone."  Patient with 2 days worsening sore throat. HX of tonsilitis. +odynophagia. ALso c/o suprapubic abdominal pain, BL flank pain and blood in urine. Same as previous abdominal pain. HX 9 mm urinary stone with lithotropsy for removal. Denies vaginal sxs.  O-well appearing, non distressed female. Chest: rrr, no m/g/r, lungs CTAB Abd: soft, TTP supapubic region, +bl CVA tenderness   A-tonsilitis vs. Pharyngitis, UTI vs kidney stone  P- 1) rapid strep, prednisone (5 days);       2) obtain UA. May need CT based on findings and repeat physical assessment.     Arthor Captain, PA-C 08/04/12 1547

## 2012-08-04 NOTE — ED Provider Notes (Signed)
Medical screening examination/treatment/procedure(s) were performed by non-physician practitioner and as supervising physician I was immediately available for consultation/collaboration.   Dione Booze, MD 08/04/12 [phone]

## 2012-08-04 NOTE — ED Notes (Signed)
Per pt sts tonsils are infected and a piece of meat is dangling. St started yesterday. sts also has a kidney stone.

## 2012-08-04 NOTE — ED Provider Notes (Signed)
History     CSN: [id number]  Arrival date & time 08/04/12  1352   First MD Initiated Contact with Patient 08/04/12 1413      Chief Complaint  Patient presents with  . Oral Swelling    (Consider location/radiation/quality/duration/timing/severity/associated sxs/prior treatment) HPI Comments: 22 y.o. female presents today complaining of gradual onset sore throat for 2 days. Patient rates severity severe. Course is persistent. Pt has taken no interventions. Nothing makes it better or worse.  Pt admits odynophagia, tonsillar swelling, swollen glands Denies fever, cough, chest pain, headaches, sinus tenderness, visual changes, changes in voice, dyspnea, nausea, vomiting, numbness, tingling, change in voice.  PMH signficant for tonsilitis  Pt also complaining of suprapubic abdominal pain, BL flank pain and blood in urine. PMHx significant for 9 mm urinary stone with lithotropsy for removal. Pt states this pain is similar. No burning on urination, no frequency, no urgency, no vaginal discharge.      Patient is a 22 y.o. female presenting with pharyngitis.  Sore Throat Associated symptoms include a sore throat. Pertinent negatives include no chest pain, diaphoresis, fever, headaches, nausea, neck pain, numbness, rash, vomiting or weakness.    Past Medical History  Diagnosis Date  . History of hypertension no meds  . Left ureteral calculus     lithrotripsy  . Frequency of urination   . Urgency of urination   . Nocturia   . ADHD (attention deficit hyperactivity disorder)   . Depression   . Bipolar 1 disorder   . Anxiety   . Schizophrenia     MILD  . HSV (herpes simplex virus) infection   . HTN in pregnancy, chronic     HX OF HTN; NO MEDS    Past Surgical History  Procedure Date  . Wisdom tooth extraction 2009- oral md office  . Cystoscopy/retrograde/ureteroscopy 08/25/2011    Procedure: CYSTOSCOPY/RETROGRADE/URETEROSCOPY;  Surgeon: Antony Haste, MD;  Location:  [facility];  Service: Urology;  Laterality: Left;  cystoscopy LEFT retrograde pyelogram LEFT URETEROSCOPY holmium  LASER LITHO AND BASKET EXTRACTION C-ARM LASER  . Kidney stone surgery 07/2011    9 MM  . Dilation and evacuation 12/29/2011    Procedure: DILATATION AND EVACUATION;  Surgeon: Purcell Nails, MD;  Location: [facility];  Service: Gynecology;  Laterality: N/A;    Family History  Problem Relation Age of Onset  . Heart disease Paternal Grandfather   . Heart disease Maternal Grandmother   . Hypertension Maternal Grandmother   . Other Mother     VARICOSE VEINS  . Anxiety disorder Mother   . Diabetes Maternal Grandfather     History  Substance Use Topics  . Smoking status: Former Smoker    Types: Cigarettes    Quit date: 11/12/2011  . Smokeless tobacco: Never Used  . Alcohol Use: Yes     Comment: RARELY DURING SOCIAL OCCASIONS    OB History    Grav Para Term Preterm Abortions TAB SAB Ect Mult Living   2 1  1      1       Review of Systems  Constitutional: Negative for fever and diaphoresis.  HENT: Positive for sore throat and trouble swallowing. Negative for facial swelling, rhinorrhea, sneezing, drooling, neck pain, neck stiffness, voice change, sinus pressure and tinnitus.   Eyes: Negative for visual disturbance.  Respiratory: Negative for apnea, chest tightness and shortness of breath.   Cardiovascular: Negative for chest pain and palpitations.  Gastrointestinal: Negative for nausea, vomiting, diarrhea and constipation.  Genitourinary: Positive for hematuria and flank pain. Negative for dysuria, urgency, frequency, vaginal discharge, difficulty urinating and pelvic pain.  Musculoskeletal: Negative for gait problem.  Skin: Negative for rash.  Neurological: Negative for dizziness, weakness, light-headedness, numbness and headaches.    Allergies  Review of patient's allergies indicates no known allergies.  Home Medications   Current Outpatient Rx    Name  Route  Sig  Dispense  Refill  . ACETAMINOPHEN 500 MG PO TABS   Oral   Take 2,000 mg by mouth every 6 (six) hours as needed. For pain         . ATOMOXETINE HCL 10 MG PO CAPS   Oral   Take 10 mg by mouth daily.         Marland Kitchen CLONAZEPAM 0.5 MG PO TABS   Oral   Take 0.5 mg by mouth 3 (three) times daily as needed. For anxiety         . IBUPROFEN 600 MG PO TABS   Oral   Take 1,200 mg by mouth every 6 (six) hours as needed. For pain         . LITHIUM CARBONATE 150 MG PO CAPS   Oral   Take 150 mg by mouth 2 (two) times daily with a meal.         . NYQUIL PO   Oral   Take 30 mLs by mouth 4 (four) times daily as needed. For cold/flu symptoms           BP 107/87  Pulse 70  Temp 98.8 F (37.1 C)  Resp 18  SpO2 99%  LMP 08/04/2012  Physical Exam  Nursing note and vitals reviewed. Constitutional: She is oriented to person, place, and time. She appears well-developed and well-nourished. No distress.  HENT:  Head: Normocephalic and atraumatic.       erythematous throat, anterior cervical lymphadenopathy  Eyes: EOM are normal. Pupils are equal, round, and reactive to light.  Neck: Normal range of motion. Neck supple.       No meningeal signs  Cardiovascular: Normal rate, regular rhythm and normal heart sounds.  Exam reveals no gallop and no friction rub.   No murmur heard. Pulmonary/Chest: Effort normal and breath sounds normal. No respiratory distress. She has no wheezes. She has no rales. She exhibits no tenderness.  Abdominal: Soft. Bowel sounds are normal. She exhibits no distension. There is tenderness. There is no rebound and no guarding.       Suprapubic tenderness, negative Rovsing, negative Murphy, no pain at McBurney's point  Genitourinary:       Bilateral flank pain  Musculoskeletal: Normal range of motion. She exhibits no edema and no tenderness.  Neurological: She is alert and oriented to person, place, and time. No cranial nerve deficit.  Skin: Skin  is warm and dry. She is not diaphoretic. No erythema.    ED Course  Procedures (including critical care time)  Labs Reviewed  RAPID STREP SCREEN - Abnormal; Notable for the following:    Streptococcus, Group A Screen (Direct) POSITIVE (*)     All other components within normal limits  URINALYSIS, ROUTINE W REFLEX MICROSCOPIC - Abnormal; Notable for the following:    Color, Urine RED (*)  BIOCHEMICALS MAY BE AFFECTED BY COLOR   APPearance CLOUDY (*)     Hgb urine dipstick LARGE (*)     Ketones, ur 15 (*)     Protein, ur 30 (*)     Leukocytes, UA SMALL (*)  All other components within normal limits  URINE MICROSCOPIC-ADD ON - Abnormal; Notable for the following:    Squamous Epithelial / LPF FEW (*)     Bacteria, UA FEW (*)     All other components within normal limits  POCT PREGNANCY, URINE  URINE CULTURE    Ct Abdomen Pelvis Wo Contrast  08/04/2012  *RADIOLOGY REPORT*  Clinical Data: Lower back pain and abdominal pain.  CT ABDOMEN AND PELVIS WITHOUT CONTRAST  Technique:  Multidetector CT imaging of the abdomen and pelvis was performed following the standard protocol without intravenous contrast.  Comparison: CT exam 08/09/2011  Findings: Lung bases are clear.  There is no evidence for free intraperitoneal air. Unenhanced CT was performed per clinician order.  Lack of IV contrast limits sensitivity and specificity, especially for evaluation of abdominal/pelvic solid viscera.  The right kidney appears to be edematous and there is mild fullness of the right renal collecting system and right ureter.  No evidence for a kidney or ureter stone.  No definite stones within the urinary bladder.  No gross abnormality to the liver, gallbladder, left kidney, spleen, pancreas or adrenal glands.  There is no significant free fluid or lymphadenopathy.  No gross abnormality to the uterus or adnexal structures.  Normal appearance of the appendix.  No bony abnormality.  IMPRESSION: The right kidney appears  to be edematous.  There is no evidence for an obstructing stone.  Mild fullness of the right ureter. Differential diagnosis would include pyelonephritis versus recently passed kidney stone.   Original Report Authenticated By: Richarda Overlie, M.D.    No results found.   Strep, possible passed stone    MDM  Streptococcal pharyngitis vs pharyngitis vs tonsillitis. Rapid strep positive for strep. UTI vs kidney stone. Urinalysis negative for UTI. Requested CT scan which showed no evidence for an obstructing stone; mild fullness of the right ureter; possible recently passed kidney stone.  Pt treated with IM PCN in ED and discharged. Pt in agreement with plan.      Glade Nurse, PA-C 08/05/12 1237

## 2012-08-05 NOTE — ED Provider Notes (Signed)
Medical screening examination/treatment/procedure(s) were conducted as a shared visit with non-physician practitioner(s) and myself.  I personally evaluated the patient during the encounter. Pt with sore throat, UA that showed RBC's, negative CT of abdomen for stone, rapid strep positive. Rx for strep with Bicillin LA.    Carleene Cooper III, MD 08/05/12 [phone]

## 2012-08-06 LAB — URINE CULTURE: Colony Count: >=100000

## 2012-08-07 NOTE — ED Notes (Signed)
+   urine Chart sent to EDP office for review. 

## 2012-08-09 NOTE — ED Notes (Signed)
Chart returned from EDP office . Recommend Cipro 500 mg po BID x 7 days written by Fayrene Helper

## 2013-03-31 ENCOUNTER — Inpatient Hospital Stay (HOSPITAL_COMMUNITY): Payer: Medicaid Other

## 2013-03-31 ENCOUNTER — Inpatient Hospital Stay (HOSPITAL_COMMUNITY)
Admission: AD | Admit: 2013-03-31 | Discharge: 2013-03-31 | Disposition: A | Discharge status: Home / Self Care | Payer: Medicaid Other | Source: Ambulatory Visit | Attending: Obstetrics and Gynecology | Admitting: Obstetrics and Gynecology

## 2013-03-31 ENCOUNTER — Encounter (HOSPITAL_COMMUNITY): Payer: Self-pay

## 2013-03-31 DIAGNOSIS — O98819 Other maternal infectious and parasitic diseases complicating pregnancy, unspecified trimester: Secondary | ICD-10-CM | POA: Insufficient documentation

## 2013-03-31 DIAGNOSIS — O2390 Unspecified genitourinary tract infection in pregnancy, unspecified trimester: Secondary | ICD-10-CM | POA: Insufficient documentation

## 2013-03-31 DIAGNOSIS — B9689 Other specified bacterial agents as the cause of diseases classified elsewhere: Secondary | ICD-10-CM | POA: Insufficient documentation

## 2013-03-31 DIAGNOSIS — R109 Unspecified abdominal pain: Secondary | ICD-10-CM | POA: Insufficient documentation

## 2013-03-31 DIAGNOSIS — A5901 Trichomonal vulvovaginitis: Secondary | ICD-10-CM

## 2013-03-31 DIAGNOSIS — N760 Acute vaginitis: Secondary | ICD-10-CM | POA: Insufficient documentation

## 2013-03-31 DIAGNOSIS — A499 Bacterial infection, unspecified: Secondary | ICD-10-CM | POA: Insufficient documentation

## 2013-03-31 DIAGNOSIS — Z3490 Encounter for supervision of normal pregnancy, unspecified, unspecified trimester: Secondary | ICD-10-CM

## 2013-03-31 LAB — URINE MICROSCOPIC-ADD ON

## 2013-03-31 LAB — URINALYSIS, ROUTINE W REFLEX MICROSCOPIC
Bilirubin Urine: NEGATIVE
Glucose, UA: NEGATIVE mg/dL
Ketones, ur: NEGATIVE mg/dL
Leukocytes, UA: NEGATIVE
pH: 6 (ref 5.0–8.0)

## 2013-03-31 LAB — CBC
HCT: 41.3 % (ref 36.0–46.0)
MCH: 31.7 pg (ref 26.0–34.0)
MCHC: 33.7 g/dL (ref 30.0–36.0)
RDW: 13.7 % (ref 11.5–15.5)

## 2013-03-31 LAB — WET PREP, GENITAL

## 2013-03-31 LAB — HCG, QUANTITATIVE, PREGNANCY: hCG, Beta Chain, Quant, S: 3177 m[IU]/mL — ABNORMAL HIGH (ref <5)

## 2013-03-31 MED ORDER — METRONIDAZOLE 500 MG PO TABS: 500.0000 mg | ORAL_TABLET | Freq: Two times a day (BID) | ORAL | Status: DC

## 2013-03-31 NOTE — MAU Provider Note (Signed)
History     CSN: [id number]  Arrival date and time: 03/31/13 4098   First Provider Initiated Contact with Patient 03/31/13 1402      Chief Complaint  Patient presents with  . Possible Pregnancy  . Abdominal Pain  . Back Pain  . Vaginal Discharge   HPI [name] [name] is a 22 year old [id number] reporting for evaluation of possible pregnancy and abdominal cramping. Patient states that she had unprotected sex with on August 23rd with a female partner and another episode with a different female partner 1 week later. For the past 2-3 days, she has experienced suprapubic cramping, spotting, dysuria, and vaginal discharge (white, no blood, no odor). She took a urine pregnancy test 2 days ago that returned positive.She also mentions experiencing chronic lower back pain with no recent changes. She denies any nausea, vomiting, dizziness, or fevers. She was seen at [facility] with her last pregnancy and plans to go back to them.    Past Medical History  Diagnosis Date  . History of hypertension no meds  . Left ureteral calculus     lithrotripsy  . Frequency of urination   . Urgency of urination   . Nocturia   . ADHD (attention deficit hyperactivity disorder)   . Depression   . Bipolar 1 disorder   . Anxiety   . Schizophrenia     MILD  . HTN in pregnancy, chronic     HX OF HTN; NO MEDS    Past Surgical History  Procedure Laterality Date  . Wisdom tooth extraction  2009- oral md office  . Cystoscopy/retrograde/ureteroscopy  08/25/2011    Procedure: CYSTOSCOPY/RETROGRADE/URETEROSCOPY;  Surgeon: Antony Haste, MD;  Location: [facility];  Service: Urology;  Laterality: Left;  cystoscopy LEFT retrograde pyelogram LEFT URETEROSCOPY holmium  LASER LITHO AND BASKET EXTRACTION C-ARM LASER  . Kidney stone surgery  07/2011    9 MM  . Dilation and evacuation  12/29/2011    Procedure: DILATATION AND EVACUATION;  Surgeon: Purcell Nails, MD;  Location: [facility];   Service: Gynecology;  Laterality: N/A;    Family History  Problem Relation Age of Onset  . Heart disease Paternal Grandfather   . Heart disease Maternal Grandmother   . Hypertension Maternal Grandmother   . Other Mother     VARICOSE VEINS  . Anxiety disorder Mother   . Diabetes Maternal Grandfather     History  Substance Use Topics  . Smoking status: Former Smoker    Types: Cigarettes    Quit date: 11/12/2011  . Smokeless tobacco: Never Used  . Alcohol Use: Yes     Comment: RARELY DURING SOCIAL OCCASIONS    Allergies: No Known Allergies  Prescriptions prior to admission  Medication Sig Dispense Refill  . clonazePAM (KLONOPIN) 0.5 MG tablet Take 0.5 mg by mouth 3 (three) times daily as needed. For anxiety       Results for orders placed during the hospital encounter of 03/31/13 (from the past 24 hour(s))  URINALYSIS, ROUTINE W REFLEX MICROSCOPIC     Status: Abnormal   Collection Time    03/31/13 10:35 AM      Result Value Range   Color, Urine YELLOW  YELLOW   APPearance CLEAR  CLEAR   Specific Gravity, Urine 1.010  1.005 - 1.030   pH 6.0  5.0 - 8.0   Glucose, UA NEGATIVE  NEGATIVE mg/dL   Hgb urine dipstick TRACE (*) NEGATIVE   Bilirubin Urine NEGATIVE  NEGATIVE  Ketones, ur NEGATIVE  NEGATIVE mg/dL   Protein, ur NEGATIVE  NEGATIVE mg/dL   Urobilinogen, UA 0.2  0.0 - 1.0 mg/dL   Nitrite NEGATIVE  NEGATIVE   Leukocytes, UA NEGATIVE  NEGATIVE  URINE MICROSCOPIC-ADD ON     Status: None   Collection Time    03/31/13 10:35 AM      Result Value Range   Squamous Epithelial / LPF RARE  RARE   WBC, UA 0-2  <3 WBC/hpf   RBC / HPF 3-6  <3 RBC/hpf  POCT PREGNANCY, URINE     Status: Abnormal   Collection Time    03/31/13 10:36 AM      Result Value Range   Preg Test, Ur POSITIVE (*) NEGATIVE  CBC     Status: Abnormal   Collection Time    03/31/13  2:40 PM      Result Value Range   WBC 11.3 (*) 4.0 - 10.5 K/uL   RBC 4.39  3.87 - 5.11 MIL/uL   Hemoglobin 13.9  12.0 -  15.0 g/dL   HCT 16.1  09.6 - 04.5 %   MCV 94.1  78.0 - 100.0 fL   MCH 31.7  26.0 - 34.0 pg   MCHC 33.7  30.0 - 36.0 g/dL   RDW 40.9  81.1 - 91.4 %   Platelets 252  150 - 400 K/uL  ABO/RH     Status: None   Collection Time    03/31/13  2:40 PM      Result Value Range   ABO/RH(D) A POS    HCG, QUANTITATIVE, PREGNANCY     Status: Abnormal   Collection Time    03/31/13  2:40 PM      Result Value Range   hCG, Beta Chain, Quant, S 3177 (*) <5 mIU/mL  WET PREP, GENITAL     Status: Abnormal   Collection Time    03/31/13  2:45 PM      Result Value Range   Yeast Wet Prep HPF POC NONE SEEN  NONE SEEN   Trich, Wet Prep FEW (*) NONE SEEN   Clue Cells Wet Prep HPF POC FEW (*) NONE SEEN   WBC, Wet Prep HPF POC FEW (*) NONE SEEN   US Ob Comp Less 14 Wks  03/31/2013   *RADIOLOGY REPORT*  Clinical Data: Vaginal bleeding, pelvic pain  OBSTETRIC <14 WK [place] AND TRANSVAGINAL OB US  Technique:  Both transabdominal and transvaginal ultrasound examinations were performed for complete evaluation of the gestation as well as the maternal uterus, adnexal regions, and pelvic cul-de-sac.  Transvaginal technique was performed to assess early pregnancy.  Comparison:  None.  Intrauterine gestational sac:  Visualized/normal in shape. Yolk sac: Visualized Embryo: Not visualized Cardiac Activity: Not visualized  MSD: 7 mm  5 w 2 d      [place] EDC: 11/29/13  Maternal uterus/adnexae: The ovaries are normal.  No free fluid.  IMPRESSION: Intrauterine gestational sac and yolk sac but no fetal pole or cardiac activity yet visualized.  Follow-up ultrasound is recommended in 10-14 days.  No acute abnormality.   Original Report Authenticated By: Christiana Pellant, M.D.   US Ob Transvaginal  03/31/2013   *RADIOLOGY REPORT*  Clinical Data: Vaginal bleeding, pelvic pain  OBSTETRIC <14 WK [place] AND TRANSVAGINAL OB US  Technique:  Both transabdominal and transvaginal ultrasound examinations were performed for complete evaluation of the gestation as  well as the maternal uterus, adnexal regions, and pelvic cul-de-sac.  Transvaginal technique was performed to  assess early pregnancy.  Comparison:  None.  Intrauterine gestational sac:  Visualized/normal in shape. Yolk sac: Visualized Embryo: Not visualized Cardiac Activity: Not visualized  MSD: 7 mm  5 w 2 d      [place] EDC: 11/29/13  Maternal uterus/adnexae: The ovaries are normal.  No free fluid.  IMPRESSION: Intrauterine gestational sac and yolk sac but no fetal pole or cardiac activity yet visualized.  Follow-up ultrasound is recommended in 10-14 days.  No acute abnormality.   Original Report Authenticated By: Christiana Pellant, M.D.    Review of Systems  Constitutional: Negative for fever and chills.  Respiratory: Negative for cough and shortness of breath.   Gastrointestinal: Positive for abdominal pain. Negative for nausea, vomiting and diarrhea.  Genitourinary: Positive for dysuria. Negative for urgency and frequency.  Musculoskeletal: Positive for back pain.  Skin: Negative for itching and rash.  Neurological: Positive for headaches. Negative for dizziness.   Physical Exam   Blood pressure 129/68, pulse 69, temperature 98.4 F (36.9 C), temperature source Oral, resp. rate 16, height 5' 6.5" (1.689 m), weight 84.097 kg (185 lb 6.4 oz), last menstrual period 02/16/2013, SpO2 100.00%.  Physical Exam  Nursing note and vitals reviewed. Constitutional: She appears well-developed and well-nourished. No distress.  Cardiovascular: Normal rate, regular rhythm, normal heart sounds and intact distal pulses.   Respiratory: Effort normal and breath sounds normal.  GI: Soft. Bowel sounds are normal. There is tenderness (suprapubic ). There is no CVA tenderness.  Genitourinary: Vagina normal. There is no rash, tenderness or lesion on the right labia. There is no rash, tenderness or lesion on the left labia.  Pelvic exam: Cervix pink, visually closed, without lesion, white creamy discharge, vaginal walls  and external genitalia normal Bimanual exam: closed os, neg CMT, uterus nontender, nonenlarged, adnexa without tenderness, enlargement, or mass  Neurological: She is alert.  Skin: Skin is warm and dry.  Psychiatric: She has a normal mood and affect.    MAU Course  Procedures  MDM UA Quantitative hCG  CBC ABO/Rh  Wet prep GC/Chlamydia- pending  US OB   O positive blood type Assessment and Plan  Assessment :  Trichomonas  Bacterial vaginosis  IUP: based on US done 02/28/2013  Plan:  Discharge home Pregnancy verification letter provided to patient Pelvic rest discussed  RX:  Flagyl 500 mg PO BID times 7 days (#14) no rf You have been diagnosed with a sexually transmitted disease.  You need to be treated and your partner(s) will need to be treated.  No sex until 10 days after you finished your medicine and no sex until 10 days after your partner has taken their medication.Get your prescriptions filled and take all as directed. Your pregnancy test is positive.  No smoking, no drugs, no alcohol.  Take a prenatal vitamin one by mouth every day.  Eat small frequent snacks to avoid nausea.  Begin prenatal care as soon as possible. Return to MAU as needed or if symptoms worsen  Coy Saunas 03/31/2013, 2:06 PM  Evaluation and management procedures were performed by the PA student under my supervision and collaboration. I have reviewed the note and chart, and I agree with the management and plan. Iona Hansen Rasch, NP 03/31/2013 3:55 PM
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
## 2013-04-01 LAB — GC/CHLAMYDIA PROBE AMP: GC Probe RNA: POSITIVE — AB

## 2013-04-01 NOTE — MAU Provider Note (Signed)
Attestation of Attending Supervision of Advanced Practitioner (CNM/NP): Evaluation and management procedures were performed by the Advanced Practitioner under my supervision and collaboration.  I have reviewed the Advanced Practitioner's note and chart, and I agree with the management and plan.  Marialuisa Basara 04/01/2013 3:52 AM

## 2013-04-18 LAB — OB RESULTS CONSOLE HIV ANTIBODY (ROUTINE TESTING): HIV: NONREACTIVE

## 2013-04-18 LAB — OB RESULTS CONSOLE RPR: RPR: NONREACTIVE

## 2013-04-24 ENCOUNTER — Inpatient Hospital Stay (HOSPITAL_COMMUNITY)
Admission: AD | Admit: 2013-04-24 | Discharge: 2013-04-24 | Disposition: A | Discharge status: Home / Self Care | Payer: Medicaid Other | Source: Ambulatory Visit | Attending: Obstetrics and Gynecology | Admitting: Obstetrics and Gynecology

## 2013-04-24 ENCOUNTER — Inpatient Hospital Stay (HOSPITAL_COMMUNITY): Payer: Medicaid Other

## 2013-04-24 DIAGNOSIS — O99891 Other specified diseases and conditions complicating pregnancy: Secondary | ICD-10-CM | POA: Insufficient documentation

## 2013-04-24 DIAGNOSIS — M549 Dorsalgia, unspecified: Secondary | ICD-10-CM | POA: Insufficient documentation

## 2013-04-24 DIAGNOSIS — O3680X1 Pregnancy with inconclusive fetal viability, fetus 1: Secondary | ICD-10-CM

## 2013-04-24 DIAGNOSIS — R109 Unspecified abdominal pain: Secondary | ICD-10-CM | POA: Insufficient documentation

## 2013-04-24 NOTE — MAU Provider Note (Signed)
  History     CSN: [id number]  Arrival date and time: 04/24/13 1056   None     Chief Complaint  Patient presents with  . Abdominal Pain   HPI Pt presents to MAU after being seen in office c/o low abd and back pain and here reports cramping.  No bleeding, fever, chills or other problems.  Pt worried because she had missed ab with last pregnancy.  OB History   Grav Para Term Preterm Abortions TAB SAB Ect Mult Living   3 1  1      1       Past Medical History  Diagnosis Date  . History of hypertension no meds  . Left ureteral calculus     lithrotripsy  . Frequency of urination   . Urgency of urination   . Nocturia   . ADHD (attention deficit hyperactivity disorder)   . Depression   . Bipolar 1 disorder   . Anxiety   . Schizophrenia     MILD  . HTN in pregnancy, chronic     HX OF HTN; NO MEDS    Past Surgical History  Procedure Laterality Date  . Wisdom tooth extraction  2009- oral md office  . Cystoscopy/retrograde/ureteroscopy  08/25/2011    Procedure: CYSTOSCOPY/RETROGRADE/URETEROSCOPY;  Surgeon: Antony Haste, MD;  Location: [facility];  Service: Urology;  Laterality: Left;  cystoscopy LEFT retrograde pyelogram LEFT URETEROSCOPY holmium  LASER LITHO AND BASKET EXTRACTION C-ARM LASER  . Kidney stone surgery  07/2011    9 MM  . Dilation and evacuation  12/29/2011    Procedure: DILATATION AND EVACUATION;  Surgeon: Purcell Nails, MD;  Location: [facility];  Service: Gynecology;  Laterality: N/A;    Family History  Problem Relation Age of Onset  . Heart disease Paternal Grandfather   . Heart disease Maternal Grandmother   . Hypertension Maternal Grandmother   . Other Mother     VARICOSE VEINS  . Anxiety disorder Mother   . Diabetes Maternal Grandfather     History  Substance Use Topics  . Smoking status: Former Smoker    Types: Cigarettes    Quit date: 11/12/2011  . Smokeless tobacco: Never Used  . Alcohol Use: Yes     Comment:  RARELY DURING SOCIAL OCCASIONS    Allergies: No Known Allergies  Prescriptions prior to admission  Medication Sig Dispense Refill  . metroNIDAZOLE (FLAGYL) 500 MG tablet Take 1 tablet (500 mg total) by mouth 2 (two) times daily.  14 tablet  0    ROS Noncontributory  Physical Exam   Blood pressure 144/92, pulse 76, resp. rate 18, height 5' 7.5" (1.715 m), weight 84.369 kg (186 lb), last menstrual period 02/16/2013.  Physical Exam  Lungs CTA CV RRR ABD soft, NT EXT no calf tenderness VE closed long thick  MAU Course  Procedures  Ultrasound for viability   Assessment and Plan  P1 at 9+ wks with viable early pregnancy. Pt will call to schedule appt in office for 1st trimester screen and keep appt on the 29th for rob.  SAB precautions reviewed.  UCx sent from office per pt.  Questions answered.  Purcell Nails 04/24/2013, 12:32 PM

## 2013-04-24 NOTE — MAU Note (Signed)
Pt reports she has had cramping for the past 4-5 days. Went to MD office unable to do U/S in office. Sent to MAU for U/S. Denies any bleeding.

## 2013-05-06 LAB — OB RESULTS CONSOLE GC/CHLAMYDIA
Chlamydia: NEGATIVE
Gonorrhea: NEGATIVE

## 2013-07-17 NOTE — L&D Delivery Note (Signed)
Delivery Note At 1:35 AM a viable female, "[name] [name]", was delivered via  (Presentation: ; Occiput Anterior).  APGAR: 9, 9; weight 7 lb 8.1 oz (3405 g).   Placenta status: , Spontaneous, intact.  Cord: 3 vessels with the following complications: None.  Cord pH: NA Maternal temp just before delivery 99.4, just after delivery 99.3. Last dose Unasyn at [place].  Has now received 2 doses (1759 and 0001).  Anesthesia: Epidural  Episiotomy: None Lacerations: 1st degree perineal at introitus--2 sutures placed for hemostasis.  Small peri-clitoral skin split--not bleeding, no repair required. Suture Repair: 3.0 vicryl rapide Est. Blood Loss (mL): 200 cc  Mom to postpartum.  Baby to Couplet care / Skin to Skin. Placenta to pathology. Family plans outpatient circumcision. Per consult with Dr. Charlotta Newtonzan, will continue Unasyn until 24 hours afebrile.  [name] [name] 11/06/2013, 2:10 AM

## 2013-10-01 ENCOUNTER — Inpatient Hospital Stay (HOSPITAL_COMMUNITY): Payer: Medicaid Other

## 2013-10-01 ENCOUNTER — Encounter (HOSPITAL_COMMUNITY): Payer: Self-pay | Admitting: *Deleted

## 2013-10-01 ENCOUNTER — Inpatient Hospital Stay (HOSPITAL_COMMUNITY)
Admission: AD | Admit: 2013-10-01 | Discharge: 2013-10-01 | Disposition: A | Discharge status: Home / Self Care | Payer: Medicaid Other | Source: Ambulatory Visit | Attending: Obstetrics and Gynecology | Admitting: Obstetrics and Gynecology

## 2013-10-01 DIAGNOSIS — O99330 Smoking (tobacco) complicating pregnancy, unspecified trimester: Secondary | ICD-10-CM | POA: Insufficient documentation

## 2013-10-01 DIAGNOSIS — O4690 Antepartum hemorrhage, unspecified, unspecified trimester: Secondary | ICD-10-CM | POA: Insufficient documentation

## 2013-10-01 DIAGNOSIS — O10019 Pre-existing essential hypertension complicating pregnancy, unspecified trimester: Secondary | ICD-10-CM | POA: Insufficient documentation

## 2013-10-01 LAB — URINALYSIS, ROUTINE W REFLEX MICROSCOPIC
BILIRUBIN URINE: NEGATIVE
Glucose, UA: NEGATIVE mg/dL
Ketones, ur: NEGATIVE mg/dL
Nitrite: NEGATIVE
PH: 7 (ref 5.0–8.0)
Protein, ur: NEGATIVE mg/dL
SPECIFIC GRAVITY, URINE: 1.02 (ref 1.005–1.030)
Urobilinogen, UA: 0.2 mg/dL (ref 0.0–1.0)

## 2013-10-01 LAB — WET PREP, GENITAL
Clue Cells Wet Prep HPF POC: NONE SEEN
TRICH WET PREP: NONE SEEN
YEAST WET PREP: NONE SEEN

## 2013-10-01 LAB — COMPREHENSIVE METABOLIC PANEL
ALT: 9 U/L (ref 0–35)
AST: 10 U/L (ref 0–37)
Albumin: 2.7 g/dL — ABNORMAL LOW (ref 3.5–5.2)
Alkaline Phosphatase: 84 U/L (ref 39–117)
BUN: 5 mg/dL — ABNORMAL LOW (ref 6–23)
CALCIUM: 8.7 mg/dL (ref 8.4–10.5)
CHLORIDE: 105 meq/L (ref 96–112)
CO2: 25 meq/L (ref 19–32)
Creatinine, Ser: 0.78 mg/dL (ref 0.50–1.10)
GFR calc Af Amer: >90 mL/min (ref >90)
Glucose, Bld: 92 mg/dL (ref 70–99)
POTASSIUM: 4 meq/L (ref 3.7–5.3)
SODIUM: 139 meq/L (ref 137–147)
Total Protein: 6.1 g/dL (ref 6.0–8.3)

## 2013-10-01 LAB — PROTEIN / CREATININE RATIO, URINE
Creatinine, Urine: 86.68 mg/dL
Protein Creatinine Ratio: 0.2 — ABNORMAL HIGH (ref 0.00–0.15)
TOTAL PROTEIN, URINE: 17.5 mg/dL

## 2013-10-01 LAB — CBC
HCT: 36.4 % (ref 36.0–46.0)
Hemoglobin: 12.6 g/dL (ref 12.0–15.0)
MCH: 33.1 pg (ref 26.0–34.0)
MCHC: 34.6 g/dL (ref 30.0–36.0)
MCV: 95.5 fL (ref 78.0–100.0)
PLATELETS: 189 10*3/uL (ref 150–400)
RBC: 3.81 MIL/uL — AB (ref 3.87–5.11)
RDW: 13 % (ref 11.5–15.5)
WBC: 11.2 10*3/uL — AB (ref 4.0–10.5)

## 2013-10-01 LAB — LACTATE DEHYDROGENASE: LDH: 126 U/L (ref 94–250)

## 2013-10-01 LAB — URINE MICROSCOPIC-ADD ON

## 2013-10-01 LAB — URIC ACID: Uric Acid, Serum: 3 mg/dL (ref 2.4–7.0)

## 2013-10-01 NOTE — MAU Note (Signed)
Pt C/O bleeding since yesterday, also intermittent uc's started yesterday.  States her MD office told her to come to MAU if she had bleeding.

## 2013-10-01 NOTE — MAU Note (Signed)
C/o ucs since yesterday and spotting since last night;

## 2013-10-01 NOTE — MAU Provider Note (Signed)
History     CSN: [id number]  Arrival date and time: 10/01/13 1018   None     Chief Complaint  Patient presents with  . Vaginal Bleeding  . Contractions   HPI  Pt presents with some vaginal spotting and contractions.  The spotting started last evening.  No LOF, good FM  OB History   Grav Para Term Preterm Abortions TAB SAB Ect Mult Living   '3 1  1 1  1   1      '$ Past Medical History  Diagnosis Date  . History of hypertension no meds  . Left ureteral calculus     lithrotripsy  . Frequency of urination   . Urgency of urination   . Nocturia   . ADHD (attention deficit hyperactivity disorder)   . Depression   . Bipolar 1 disorder   . Anxiety   . Schizophrenia     MILD  . HTN in pregnancy, chronic     HX OF HTN; NO MEDS    Past Surgical History  Procedure Laterality Date  . Wisdom tooth extraction  2009- oral md office  . Cystoscopy/retrograde/ureteroscopy  08/25/2011    Procedure: CYSTOSCOPY/RETROGRADE/URETEROSCOPY;  Surgeon: Fredricka Bonine, MD;  Location: [facility];  Service: Urology;  Laterality: Left;  cystoscopy LEFT retrograde pyelogram LEFT URETEROSCOPY holmium  LASER LITHO AND BASKET EXTRACTION C-ARM LASER  . Kidney stone surgery  07/2011    9 MM  . Dilation and evacuation  12/29/2011    Procedure: DILATATION AND EVACUATION;  Surgeon: Delice Lesch, MD;  Location: [facility];  Service: Gynecology;  Laterality: N/A;    Family History  Problem Relation Age of Onset  . Heart disease Paternal Grandfather   . Heart disease Maternal Grandmother   . Hypertension Maternal Grandmother   . Other Mother     VARICOSE VEINS  . Anxiety disorder Mother   . Diabetes Maternal Grandfather     History  Substance Use Topics  . Smoking status: Current Every Day Smoker -- 0.25 packs/day    Types: Cigarettes    Last Attempt to Quit: 11/12/2011  . Smokeless tobacco: Never Used  . Alcohol Use: Yes     Comment: RARELY DURING SOCIAL OCCASIONS     Allergies: No Known Allergies  Prescriptions prior to admission  Medication Sig Dispense Refill  . acetaminophen (TYLENOL) 500 MG tablet Take 1,000 mg by mouth every 6 (six) hours as needed.      Marland Kitchen HYDROcodone-acetaminophen (NORCO/VICODIN) 5-325 MG per tablet Take 1 tablet by mouth every 6 (six) hours as needed for moderate pain.      Marland Kitchen lamoTRIgine (LAMICTAL) 25 MG tablet Take 25 mg by mouth daily.        ROS Physical Exam   Blood pressure 110/59, pulse 66, temperature 98.4 F (36.9 C), temperature source Oral, resp. rate 18, height $RemoveBe'5\' 7"'[id number]$  (1.702 m), weight 94.802 kg (209 lb), last menstrual period 02/16/2013.  Physical Exam Physical Examination: General appearance - alert, well appearing, and in no distress Mental status - alert, oriented to person, place, and time Chest - clear to auscultation, no wheezes, rales or rhonchi, symmetric air entry Heart - normal rate and regular rhythm Abdomen - soft, nontender, nondistended, no masses or organomegaly Pelvic - normal external genitalia, vulva, vagina, cervix, uterus and adnexa, scant bleeding seen in the vault.  No active bleeding  Extremities - peripheral pulses normal, no pedal edema, no clubbing or cyanosis  MAU Course  Procedures  MDM  Recent Results (from the past 2160 hour(s))  URINALYSIS, ROUTINE W REFLEX MICROSCOPIC     Status: Abnormal   Collection Time    10/01/13 10:35 AM      Result Value Ref Range   Color, Urine YELLOW  YELLOW   APPearance HAZY (*) CLEAR   Specific Gravity, Urine 1.020  1.005 - 1.030   pH 7.0  5.0 - 8.0   Glucose, UA NEGATIVE  NEGATIVE mg/dL   Hgb urine dipstick MODERATE (*) NEGATIVE   Bilirubin Urine NEGATIVE  NEGATIVE   Ketones, ur NEGATIVE  NEGATIVE mg/dL   Protein, ur NEGATIVE  NEGATIVE mg/dL   Urobilinogen, UA 0.2  0.0 - 1.0 mg/dL   Nitrite NEGATIVE  NEGATIVE   Leukocytes, UA SMALL (*) NEGATIVE  URINE MICROSCOPIC-ADD ON     Status: Abnormal   Collection Time    10/01/13 10:35 AM       Result Value Ref Range   Squamous Epithelial / LPF FEW (*) RARE   WBC, UA 3-6  <3 WBC/hpf   RBC / HPF 3-6  <3 RBC/hpf   Bacteria, UA RARE  RARE  PROTEIN / CREATININE RATIO, URINE     Status: Abnormal   Collection Time    10/01/13 11:26 AM      Result Value Ref Range   Creatinine, Urine 86.68     Total Protein, Urine 17.5     Comment: NO NORMAL RANGE ESTABLISHED FOR THIS TEST   PROTEIN CREATININE RATIO 0.20 (*) 0.00 - 0.15  CBC     Status: Abnormal   Collection Time    10/01/13 11:52 AM      Result Value Ref Range   WBC 11.2 (*) 4.0 - 10.5 K/uL   RBC 3.81 (*) 3.87 - 5.11 MIL/uL   Hemoglobin 12.6  12.0 - 15.0 g/dL   HCT 36.4  36.0 - 46.0 %   MCV 95.5  78.0 - 100.0 fL   MCH 33.1  26.0 - 34.0 pg   MCHC 34.6  30.0 - 36.0 g/dL   RDW 13.0  11.5 - 15.5 %   Platelets 189  150 - 400 K/uL  COMPREHENSIVE METABOLIC PANEL     Status: Abnormal   Collection Time    10/01/13 11:52 AM      Result Value Ref Range   Sodium 139  137 - 147 mEq/L   Potassium 4.0  3.7 - 5.3 mEq/L   Chloride 105  96 - 112 mEq/L   CO2 25  19 - 32 mEq/L   Glucose, Bld 92  70 - 99 mg/dL   BUN 5 (*) 6 - 23 mg/dL   Creatinine, Ser 0.78  0.50 - 1.10 mg/dL   Calcium 8.7  8.4 - 10.5 mg/dL   Total Protein 6.1  6.0 - 8.3 g/dL   Albumin 2.7 (*) 3.5 - 5.2 g/dL   AST 10  0 - 37 U/L   ALT 9  0 - 35 U/L   Alkaline Phosphatase 84  39 - 117 U/L   Total Bilirubin <0.2 (*) 0.3 - 1.2 mg/dL   Comment: REPEATED TO VERIFY   GFR calc non Af Amer >90  >90 mL/min   GFR calc Af Amer >90  >90 mL/min   Comment: (NOTE)     The eGFR has been calculated using the CKD EPI equation.     This calculation has not been validated in all clinical situations.     eGFR's persistently <90 mL/min signify possible Chronic Kidney  Disease.  URIC ACID     Status: None   Collection Time    10/01/13 11:52 AM      Result Value Ref Range   Uric Acid, Serum 3.0  2.4 - 7.0 mg/dL  LACTATE DEHYDROGENASE     Status: None   Collection Time     10/01/13 11:52 AM      Result Value Ref Range   LDH 126  94 - 250 U/L  WET PREP, GENITAL     Status: Abnormal   Collection Time    10/01/13 12:00 PM      Result Value Ref Range   Yeast Wet Prep HPF POC NONE SEEN  NONE SEEN   Trich, Wet Prep NONE SEEN  NONE SEEN   Clue Cells Wet Prep HPF POC NONE SEEN  NONE SEEN   WBC, Wet Prep HPF POC MANY (*) NONE SEEN   Comment: MODERATE BACTERIA SEEN   [place] vtx AFI 18.  No previa or abruption.  cx 3.2 cm  Assessment and Plan  [redacted]w[redacted]d  With bleeding after IC and contractions Bleeding probably from IC.  US WNL  cx l/closed Blood pressures improved and labs are normal cx l/C/-3 Will discharge home with preterm  labor precautions.    [place] A 10/01/2013, 2:44 PM

## 2013-10-01 NOTE — Discharge Instructions (Signed)
Vaginal Bleeding During Pregnancy, Third Trimester A small amount of bleeding (spotting) from the vagina is relatively common in pregnancy. Various things can cause bleeding or spotting in pregnancy. Sometimes the bleeding is normal and is not a problem. However, bleeding during the third trimester can also be a sign of something serious for the mother and the baby. Be sure to tell your health care provider about any vaginal bleeding right away.  Some possible causes of vaginal bleeding during the third trimester include:   The placenta may be partially or completely covering the opening to the cervix (placenta previa).   The placenta may have separated from the uterus (abruption of the placenta).   There may be an infection or growth on the cervix.   You may be starting labor, called discharging of the mucus plug.   The placenta may grow into the muscle layer of the uterus (placenta accreta).  HOME CARE INSTRUCTIONS  Watch your condition for any changes. The following actions may help to lessen any discomfort you are feeling:   Follow your health care provider's instructions for limiting your activity. If your health care provider orders bed rest, you may need to stay in bed and only get up to use the bathroom. However, your health care provider may allow you to continue light activity.  If needed, make plans for someone to help with your regular activities and responsibilities while you are on bed rest.  Keep track of the number of pads you use each day, how often you change pads, and how soaked (saturated) they are. Write this down.  Do not use tampons. Do not douche.  Do not have sexual intercourse or orgasms until approved by your health care provider.  Follow your health care provider's advice about lifting, driving, and physical activities.  If you pass any tissue from your vagina, save the tissue so you can show it to your health care provider.   Only take over-the-counter  or prescription medicines as directed by your health care provider.  Do not take aspirin because it can make you bleed.   Keep all follow-up appointments as directed by your health care provider. SEEK MEDICAL CARE IF:  You have any vaginal bleeding during any part of your pregnancy.  You have cramps or labor pains. SEEK IMMEDIATE MEDICAL CARE IF:   You have severe cramps or pain in your back or belly (abdomen).  You have a fever, not controlled by medicine.  You have chills.  You have a gush of fluid from the vagina.  You pass large clots or tissue from your vagina.  Your bleeding increases.  You feel lightheaded or weak.  You pass out.  You feel less movement or no movement of the baby.  MAKE SURE YOU:  Understand these instructions.  Will watch your condition.  Will get help right away if you are not doing well or get worse. Document Released: 09/23/2002 Document Revised: 04/23/2013 Document Reviewed: 03/10/2013 [facility] Patient Information 2014 [employer], [place]. Preterm Labor Information Preterm labor is when labor starts at less than 37 weeks of pregnancy. The normal length of a pregnancy is 39 to 41 weeks. CAUSES Often, there is no identifiable underlying cause as to why a woman goes into preterm labor. One of the most common known causes of preterm labor is infection. Infections of the uterus, cervix, vagina, amniotic sac, bladder, kidney, or even the lungs (pneumonia) can cause labor to start. Other suspected causes of preterm labor include:   Urogenital  infections, such as yeast infections and bacterial vaginosis.   Uterine abnormalities (uterine shape, uterine septum, fibroids, or bleeding from the placenta).   A cervix that has been operated on (it may fail to stay closed).   Malformations in the fetus.   Multiple gestations (twins, triplets, and so on).   Breakage of the amniotic sac.  RISK FACTORS  Having a previous history of preterm labor.    Having premature rupture of membranes (PROM).   Having a placenta that covers the opening of the cervix (placenta previa).   Having a placenta that separates from the uterus (placental abruption).   Having a cervix that is too weak to hold the fetus in the uterus (incompetent cervix).   Having too much fluid in the amniotic sac (polyhydramnios).   Taking illegal drugs or smoking while pregnant.   Not gaining enough weight while pregnant.   Being younger than 59 and older than 24 years old.   Having a low socioeconomic status.   Being African American. SYMPTOMS Signs and symptoms of preterm labor include:   Menstrual-like cramps, abdominal pain, or back pain.  Uterine contractions that are regular, as frequent as six in an hour, regardless of their intensity (may be mild or painful).  Contractions that start on the top of the uterus and spread down to the lower abdomen and back.   A sense of increased pelvic pressure.   A watery or bloody mucus discharge that comes from the vagina.  TREATMENT Depending on the length of the pregnancy and other circumstances, your health care provider may suggest bed rest. If necessary, there are medicines that can be given to stop contractions and to mature the fetal lungs. If labor happens before 34 weeks of pregnancy, a prolonged hospital stay may be recommended. Treatment depends on the condition of both you and the fetus.  WHAT SHOULD YOU DO IF YOU THINK YOU ARE IN PRETERM LABOR? Call your health care provider right away. You will need to go to the hospital to get checked immediately. HOW CAN YOU PREVENT PRETERM LABOR IN FUTURE PREGNANCIES? You should:   Stop smoking if you smoke.  Maintain healthy weight gain and avoid chemicals and drugs that are not necessary.  Be watchful for any type of infection.  Inform your health care provider if you have a known history of preterm labor. Document Released: 09/23/2003 Document  Revised: 03/05/2013 Document Reviewed: 08/05/2012 [facility] Patient Information 2014 [place], [place].

## 2013-10-02 LAB — GC/CHLAMYDIA PROBE AMP
CT PROBE, AMP APTIMA: POSITIVE — AB
GC Probe RNA: NEGATIVE

## 2013-11-05 ENCOUNTER — Inpatient Hospital Stay (HOSPITAL_COMMUNITY): Payer: Medicaid Other

## 2013-11-05 ENCOUNTER — Inpatient Hospital Stay (HOSPITAL_COMMUNITY)
Admission: AD | Admit: 2013-11-05 | Discharge: 2013-11-08 | DRG: 774 | Disposition: A | Discharge status: Home / Self Care | Payer: Medicaid Other | Source: Ambulatory Visit | Attending: Obstetrics and Gynecology | Admitting: Obstetrics and Gynecology

## 2013-11-05 ENCOUNTER — Inpatient Hospital Stay (HOSPITAL_COMMUNITY): Payer: Medicaid Other | Admitting: Anesthesiology

## 2013-11-05 ENCOUNTER — Encounter (HOSPITAL_COMMUNITY): Payer: Self-pay | Admitting: Obstetrics and Gynecology

## 2013-11-05 ENCOUNTER — Encounter (HOSPITAL_COMMUNITY): Payer: Medicaid Other | Admitting: Anesthesiology

## 2013-11-05 DIAGNOSIS — O368190 Decreased fetal movements, unspecified trimester, not applicable or unspecified: Secondary | ICD-10-CM | POA: Diagnosis present

## 2013-11-05 DIAGNOSIS — F259 Schizoaffective disorder, unspecified: Secondary | ICD-10-CM

## 2013-11-05 DIAGNOSIS — O98519 Other viral diseases complicating pregnancy, unspecified trimester: Secondary | ICD-10-CM | POA: Diagnosis present

## 2013-11-05 DIAGNOSIS — O9903 Anemia complicating the puerperium: Secondary | ICD-10-CM | POA: Diagnosis not present

## 2013-11-05 DIAGNOSIS — F209 Schizophrenia, unspecified: Secondary | ICD-10-CM | POA: Diagnosis present

## 2013-11-05 DIAGNOSIS — O99344 Other mental disorders complicating childbirth: Secondary | ICD-10-CM | POA: Diagnosis present

## 2013-11-05 DIAGNOSIS — F1221 Cannabis dependence, in remission: Secondary | ICD-10-CM | POA: Diagnosis present

## 2013-11-05 DIAGNOSIS — E669 Obesity, unspecified: Secondary | ICD-10-CM | POA: Diagnosis present

## 2013-11-05 DIAGNOSIS — O09299 Supervision of pregnancy with other poor reproductive or obstetric history, unspecified trimester: Secondary | ICD-10-CM

## 2013-11-05 DIAGNOSIS — Z6834 Body mass index (BMI) 34.0-34.9, adult: Secondary | ICD-10-CM

## 2013-11-05 DIAGNOSIS — O411090 Infection of amniotic sac and membranes, unspecified, unspecified trimester, not applicable or unspecified: Secondary | ICD-10-CM | POA: Diagnosis present

## 2013-11-05 DIAGNOSIS — N200 Calculus of kidney: Secondary | ICD-10-CM | POA: Diagnosis not present

## 2013-11-05 DIAGNOSIS — O1002 Pre-existing essential hypertension complicating childbirth: Secondary | ICD-10-CM | POA: Diagnosis present

## 2013-11-05 DIAGNOSIS — F1210 Cannabis abuse, uncomplicated: Secondary | ICD-10-CM | POA: Diagnosis present

## 2013-11-05 DIAGNOSIS — Z8659 Personal history of other mental and behavioral disorders: Secondary | ICD-10-CM

## 2013-11-05 DIAGNOSIS — F4310 Post-traumatic stress disorder, unspecified: Secondary | ICD-10-CM

## 2013-11-05 DIAGNOSIS — O368390 Maternal care for abnormalities of the fetal heart rate or rhythm, unspecified trimester, not applicable or unspecified: Secondary | ICD-10-CM | POA: Diagnosis present

## 2013-11-05 DIAGNOSIS — O411290 Chorioamnionitis, unspecified trimester, not applicable or unspecified: Secondary | ICD-10-CM | POA: Diagnosis present

## 2013-11-05 DIAGNOSIS — A6000 Herpesviral infection of urogenital system, unspecified: Secondary | ICD-10-CM | POA: Diagnosis present

## 2013-11-05 DIAGNOSIS — F1290 Cannabis use, unspecified, uncomplicated: Secondary | ICD-10-CM | POA: Diagnosis present

## 2013-11-05 DIAGNOSIS — Z87442 Personal history of urinary calculi: Secondary | ICD-10-CM

## 2013-11-05 DIAGNOSIS — O99214 Obesity complicating childbirth: Secondary | ICD-10-CM

## 2013-11-05 DIAGNOSIS — D649 Anemia, unspecified: Secondary | ICD-10-CM | POA: Diagnosis not present

## 2013-11-05 HISTORY — DX: Supervision of pregnancy with other poor reproductive or obstetric history, unspecified trimester: O09.299

## 2013-11-05 HISTORY — DX: Schizoaffective disorder, unspecified: F25.9

## 2013-11-05 LAB — PROTEIN / CREATININE RATIO, URINE
Creatinine, Urine: 78.22 mg/dL
PROTEIN CREATININE RATIO: 0.24 — AB (ref 0.00–0.15)
Total Protein, Urine: 18.6 mg/dL

## 2013-11-05 LAB — CBC WITH DIFFERENTIAL/PLATELET
Basophils Absolute: 0 10*3/uL (ref 0.0–0.1)
Basophils Relative: 0 % (ref 0–1)
Eosinophils Absolute: 0.1 10*3/uL (ref 0.0–0.7)
Eosinophils Relative: 0 % (ref 0–5)
HEMATOCRIT: 36.2 % (ref 36.0–46.0)
HEMOGLOBIN: 12.3 g/dL (ref 12.0–15.0)
LYMPHS ABS: 1.2 10*3/uL (ref 0.7–4.0)
Lymphocytes Relative: 7 % — ABNORMAL LOW (ref 12–46)
MCH: 32.6 pg (ref 26.0–34.0)
MCHC: 34 g/dL (ref 30.0–36.0)
MCV: 96 fL (ref 78.0–100.0)
MONOS PCT: 7 % (ref 3–12)
Monocytes Absolute: 1.2 10*3/uL — ABNORMAL HIGH (ref 0.1–1.0)
NEUTROS ABS: 14.5 10*3/uL — AB (ref 1.7–7.7)
Neutrophils Relative %: 86 % — ABNORMAL HIGH (ref 43–77)
Platelets: 169 10*3/uL (ref 150–400)
RBC: 3.77 MIL/uL — AB (ref 3.87–5.11)
RDW: 13.1 % (ref 11.5–15.5)
WBC: 16.9 10*3/uL — ABNORMAL HIGH (ref 4.0–10.5)

## 2013-11-05 LAB — COMPREHENSIVE METABOLIC PANEL
ALT: 7 U/L (ref 0–35)
AST: 10 U/L (ref 0–37)
Albumin: 2.4 g/dL — ABNORMAL LOW (ref 3.5–5.2)
Alkaline Phosphatase: 107 U/L (ref 39–117)
BUN: 6 mg/dL (ref 6–23)
CALCIUM: 8.9 mg/dL (ref 8.4–10.5)
CHLORIDE: 102 meq/L (ref 96–112)
CO2: 24 mEq/L (ref 19–32)
CREATININE: 0.82 mg/dL (ref 0.50–1.10)
Glucose, Bld: 71 mg/dL (ref 70–99)
Potassium: 4.1 mEq/L (ref 3.7–5.3)
Sodium: 139 mEq/L (ref 137–147)
Total Bilirubin: 0.3 mg/dL (ref 0.3–1.2)
Total Protein: 5.6 g/dL — ABNORMAL LOW (ref 6.0–8.3)

## 2013-11-05 LAB — TYPE AND SCREEN
ABO/RH(D): A POS
ANTIBODY SCREEN: NEGATIVE

## 2013-11-05 LAB — URIC ACID: URIC ACID, SERUM: 3.8 mg/dL (ref 2.4–7.0)

## 2013-11-05 LAB — RAPID URINE DRUG SCREEN, HOSP PERFORMED
Amphetamines: NOT DETECTED
Barbiturates: NOT DETECTED
Benzodiazepines: NOT DETECTED
COCAINE: NOT DETECTED
Opiates: NOT DETECTED
TETRAHYDROCANNABINOL: POSITIVE — AB

## 2013-11-05 LAB — LACTATE DEHYDROGENASE: LDH: 136 U/L (ref 94–250)

## 2013-11-05 LAB — GROUP B STREP BY PCR: Group B strep by PCR: NEGATIVE

## 2013-11-05 LAB — OB RESULTS CONSOLE GBS: GBS: NEGATIVE

## 2013-11-05 LAB — RPR

## 2013-11-05 MED ORDER — ZOLPIDEM TARTRATE 5 MG PO TABS: 5.0000 mg | ORAL_TABLET | Freq: Every evening | ORAL | Status: DC | PRN

## 2013-11-05 MED ORDER — ACETAMINOPHEN 650 MG RE SUPP: 650.0000 mg | RECTAL | Status: DC | PRN

## 2013-11-05 MED ORDER — LIDOCAINE HCL (PF) 1 % IJ SOLN
30.0000 mL | INTRAMUSCULAR | Status: DC | PRN
  Filled 2013-11-05: qty 30

## 2013-11-05 MED ORDER — LACTATED RINGERS IV SOLN: 500.0000 mL | Freq: Once | INTRAVENOUS | Status: DC

## 2013-11-05 MED ORDER — OXYTOCIN 40 UNITS IN LACTATED RINGERS INFUSION - SIMPLE MED
1.0000 m[IU]/min | INTRAVENOUS | Status: DC
  Administered 2013-11-05: 3 m[IU]/min via INTRAVENOUS
  Administered 2013-11-05: 2 m[IU]/min via INTRAVENOUS
  Filled 2013-11-05: qty 1000

## 2013-11-05 MED ORDER — ONDANSETRON HCL 4 MG/2ML IJ SOLN: 8.0000 mg | Freq: Once | INTRAMUSCULAR | Status: DC

## 2013-11-05 MED ORDER — PRENATAL MULTIVITAMIN CH: 1.0000 | ORAL_TABLET | Freq: Every day | ORAL | Status: DC

## 2013-11-05 MED ORDER — ONDANSETRON 8 MG/NS 50 ML IVPB
8.0000 mg | Freq: Once | INTRAVENOUS | Status: DC
Start: 2013-11-05 — End: 2013-11-05
  Filled 2013-11-05: qty 8

## 2013-11-05 MED ORDER — FENTANYL 2.5 MCG/ML BUPIVACAINE 1/10 % EPIDURAL INFUSION (WH - ANES)
INTRAMUSCULAR | Status: DC | PRN
  Administered 2013-11-05: 14 mL/h via EPIDURAL

## 2013-11-05 MED ORDER — LACTATED RINGERS IV SOLN: INTRAVENOUS | Status: DC

## 2013-11-05 MED ORDER — OXYTOCIN 40 UNITS IN LACTATED RINGERS INFUSION - SIMPLE MED: 62.5000 mL/h | INTRAVENOUS | Status: DC

## 2013-11-05 MED ORDER — BUTORPHANOL TARTRATE 1 MG/ML IJ SOLN
INTRAMUSCULAR | Status: AC
Start: 2013-11-05 — End: 2013-11-05
  Administered 2013-11-05: 2 mg via INTRAVENOUS
  Filled 2013-11-05: qty 2

## 2013-11-05 MED ORDER — FENTANYL CITRATE 0.05 MG/ML IJ SOLN: 50.0000 ug | Freq: Once | INTRAMUSCULAR | Status: DC

## 2013-11-05 MED ORDER — IBUPROFEN 600 MG PO TABS
600.0000 mg | ORAL_TABLET | Freq: Four times a day (QID) | ORAL | Status: DC | PRN
  Administered 2013-11-06: 600 mg via ORAL
  Filled 2013-11-05: qty 1

## 2013-11-05 MED ORDER — ONDANSETRON HCL 4 MG/2ML IJ SOLN
4.0000 mg | Freq: Four times a day (QID) | INTRAMUSCULAR | Status: DC | PRN
  Administered 2013-11-05: 4 mg via INTRAVENOUS
  Filled 2013-11-05: qty 2

## 2013-11-05 MED ORDER — LACTATED RINGERS IV SOLN
INTRAVENOUS | Status: DC
  Administered 2013-11-05 (×3): via INTRAVENOUS

## 2013-11-05 MED ORDER — LACTATED RINGERS IV SOLN: 500.0000 mL | INTRAVENOUS | Status: DC | PRN

## 2013-11-05 MED ORDER — PHENYLEPHRINE 40 MCG/ML (10ML) SYRINGE FOR IV PUSH (FOR BLOOD PRESSURE SUPPORT)
80.0000 ug | PREFILLED_SYRINGE | INTRAVENOUS | Status: DC | PRN
  Filled 2013-11-05: qty 2

## 2013-11-05 MED ORDER — OXYTOCIN BOLUS FROM INFUSION: 500.0000 mL | INTRAVENOUS | Status: DC

## 2013-11-05 MED ORDER — ACETAMINOPHEN 325 MG PO TABS
650.0000 mg | ORAL_TABLET | ORAL | Status: DC | PRN
  Filled 2013-11-05: qty 2

## 2013-11-05 MED ORDER — FENTANYL 2.5 MCG/ML BUPIVACAINE 1/10 % EPIDURAL INFUSION (WH - ANES): 14.0000 mL/h | INTRAMUSCULAR | Status: DC | PRN

## 2013-11-05 MED ORDER — EPHEDRINE 5 MG/ML INJ
10.0000 mg | INTRAVENOUS | Status: DC | PRN
Start: 2013-11-05 — End: 2013-11-06
  Filled 2013-11-05: qty 2

## 2013-11-05 MED ORDER — FENTANYL 2.5 MCG/ML BUPIVACAINE 1/10 % EPIDURAL INFUSION (WH - ANES)
INTRAMUSCULAR | Status: AC
  Filled 2013-11-05: qty 125

## 2013-11-05 MED ORDER — EPHEDRINE 5 MG/ML INJ
10.0000 mg | INTRAVENOUS | Status: DC | PRN
  Filled 2013-11-05: qty 2

## 2013-11-05 MED ORDER — OXYCODONE-ACETAMINOPHEN 5-325 MG PO TABS
1.0000 | ORAL_TABLET | ORAL | Status: DC | PRN
  Administered 2013-11-06: 2 via ORAL
  Filled 2013-11-05: qty 2

## 2013-11-05 MED ORDER — ACETAMINOPHEN 325 MG PO TABS: 650.0000 mg | ORAL_TABLET | ORAL | Status: DC | PRN

## 2013-11-05 MED ORDER — SODIUM CHLORIDE 0.9 % IV SOLN
3.0000 g | Freq: Four times a day (QID) | INTRAVENOUS | Status: DC
  Administered 2013-11-05 – 2013-11-06 (×5): 3 g via INTRAVENOUS
  Filled 2013-11-05 (×6): qty 3

## 2013-11-05 MED ORDER — BUTORPHANOL TARTRATE 1 MG/ML IJ SOLN
1.0000 mg | INTRAMUSCULAR | Status: DC | PRN
  Administered 2013-11-05: 1 mg via INTRAVENOUS
  Filled 2013-11-05: qty 1

## 2013-11-05 MED ORDER — CITRIC ACID-SODIUM CITRATE 334-500 MG/5ML PO SOLN: 30.0000 mL | ORAL | Status: DC | PRN

## 2013-11-05 MED ORDER — CALCIUM CARBONATE ANTACID 500 MG PO CHEW: 2.0000 | CHEWABLE_TABLET | ORAL | Status: DC | PRN

## 2013-11-05 MED ORDER — TERBUTALINE SULFATE 1 MG/ML IJ SOLN
0.2500 mg | Freq: Once | INTRAMUSCULAR | Status: AC | PRN
Start: 2013-11-05 — End: 2013-11-05

## 2013-11-05 MED ORDER — BUTORPHANOL TARTRATE 1 MG/ML IJ SOLN
2.0000 mg | Freq: Once | INTRAMUSCULAR | Status: AC
  Administered 2013-11-05: 2 mg via INTRAVENOUS

## 2013-11-05 MED ORDER — DIPHENHYDRAMINE HCL 50 MG/ML IJ SOLN: 12.5000 mg | INTRAMUSCULAR | Status: DC | PRN

## 2013-11-05 MED ORDER — ACETAMINOPHEN 500 MG PO TABS
1000.0000 mg | ORAL_TABLET | Freq: Four times a day (QID) | ORAL | Status: DC | PRN
  Administered 2013-11-05: 1000 mg via ORAL
  Filled 2013-11-05: qty 2

## 2013-11-05 MED ORDER — SODIUM BICARBONATE 8.4 % IV SOLN
INTRAVENOUS | Status: DC | PRN
  Administered 2013-11-05: 5 mL via EPIDURAL

## 2013-11-05 MED ORDER — DOCUSATE SODIUM 100 MG PO CAPS: 100.0000 mg | ORAL_CAPSULE | Freq: Every day | ORAL | Status: DC

## 2013-11-05 NOTE — Progress Notes (Signed)
  Subjective: Comfortable with epidural.  Watching movies on computer.  Objective: BP 118/71  Pulse 106  Temp(Src) 99.2 F (37.3 C) (Oral)  Resp 20  Ht 5' 7.5" (1.715 m)  Wt 225 lb (102.059 kg)  BMI 34.70 kg/m2  SpO2 100%  LMP 02/16/2013      Filed Vitals:   11/05/13 2226 11/05/13 2230 11/05/13 2231 11/05/13 2236  BP:   118/71   Pulse: 89 93 94 106  Temp:      TempSrc:      Resp:      Height:      Weight:      SpO2: 98%  100% 100%    FHT: Category 1 UC:   regular, every 2 minutes SVE:   Dilation: 5 Effacement (%): 80 Station: -1 Exam by:: VLathamCNM AROM--clear fluid IUPC placed without difficulty Pitocin at 2 mu/min (had decreased pit prior to epidural placement, due to UCs q 1-2 min.  Assessment:  Progressive labor Chorioamnionitis  Plan: Continue current care Augment to establish/ensure adequacy.  Nigel BridgemanVicki Maurica Omura 11/05/2013, 10:43 PM

## 2013-11-05 NOTE — Anesthesia Procedure Notes (Signed)
Epidural Patient location during procedure: OB  Preanesthetic Checklist Completed: patient identified, site marked, surgical consent, pre-op evaluation, timeout performed, IV checked, risks and benefits discussed and monitors and equipment checked  Epidural Patient position: sitting Prep: site prepped and draped and DuraPrep Patient monitoring: continuous pulse ox and blood pressure Approach: midline Injection technique: LOR air  Needle:  Needle type: Tuohy  Needle gauge: 17 G Needle length: 9 cm and 9 Needle insertion depth: 6 cm Catheter type: closed end flexible Catheter size: 19 Gauge Catheter at skin depth: 12 cm Test dose: negative  Assessment Events: blood not aspirated, injection not painful, no injection resistance, negative IV test and no paresthesia  Additional Notes Easy 1st pass LOR,  Dosing of Epidural:  1st dose, through catheter ............................................Marland Kitchen. epi 1:200K + Xylocaine 40 mg  2nd dose, through catheter, after waiting 3 minutes...Marland Kitchen.Marland Kitchen.epi 1:200K + Xylocaine 60 mg    ( 2% Xylo charted as a single dose in Epic Meds for ease of charting; actual dosing was fractionated as above, for saftey's sake)  As each dose occurred, patient was free of IV sx; and patient exhibited no evidence of SA injection.  Patient is more comfortable after epidural dosed. Please see RN's note for documentation of vital signs,and FHR which are stable.  Patient reminded not to try to ambulate with numb legs, and that an RN must be present when she attempts to get up.

## 2013-11-05 NOTE — Progress Notes (Signed)
  Subjective: Breathing with UCs.  Shaking with chills at times.  Wants to wait until 6 cm for epidural.    Objective: BP 139/87  Pulse 107  Temp(Src) 101.7 F (38.7 C) (Oral)  Resp 22  Ht 5' 7.5" (1.715 m)  Wt 225 lb (102.059 kg)  BMI 34.70 kg/m2  LMP 02/16/2013      Filed Vitals:   11/05/13 1822 11/05/13 1823 11/05/13 1908 11/05/13 1935  BP: 141/91  123/66 139/87  Pulse: 105  102 107  Temp:  101.4 F (38.6 C)  101.7 F (38.7 C)  TempSrc:    Oral  Resp:    22  Height:      Weight:       Received Stadol 2 mg at 1730 with some benefit On Unasyn 3 gm IV q 6 hours--1st dose at 1749 No Tylenol since admission  FHT: Category 1 overall--baseline 150-160, moderate variability UC:   regular, every 2-3 minutes SVE:   4 cm, 90%, vtx, -1, bloody show Pitocin at 3 mu/min  Assessment:  IUP at 36 4/7 weeks Chorioamnionitis GBS negative by PCR today Schizoaffective disorder, PTSD, bipolar--on Lamictal. + UDS--marijuana.  Plan: Tylenol 1 gm now. Stadol 1 mg now. Plan recheck in 1 hour--AROM then. Support to patient for issues. SW consult already ordered. Dr. Charlotta [name] updated.  [name] BridgemanVicki Raelene [name] 11/05/2013, 7:43 PM

## 2013-11-05 NOTE — Anesthesia Preprocedure Evaluation (Signed)
Anesthesia Evaluation  Patient identified by MRN, date of birth, ID band Patient awake    Reviewed: Allergy & Precautions, H&P , Patient's Chart, lab work & pertinent test results  Airway Mallampati: III TM Distance: >3 FB Neck ROM: full    Dental   Pulmonary former smoker,  breath sounds clear to auscultation        Cardiovascular hypertension, Rhythm:regular Rate:Normal     Neuro/Psych PSYCHIATRIC DISORDERS Anxiety Depression    GI/Hepatic   Endo/Other  Morbid obesity  Renal/GU      Musculoskeletal   Abdominal   Peds  Hematology   Anesthesia Other Findings   Reproductive/Obstetrics (+) Pregnancy                           Anesthesia Physical Anesthesia Plan  ASA: III  Anesthesia Plan: Epidural   Post-op Pain Management:    Induction:   Airway Management Planned:   Additional Equipment:   Intra-op Plan:   Post-operative Plan:   Informed Consent: I have reviewed the patients History and Physical, chart, labs and discussed the procedure including the risks, benefits and alternatives for the proposed anesthesia with the patient or authorized representative who has indicated his/her understanding and acceptance.     Plan Discussed with:   Anesthesia Plan Comments:         Anesthesia Quick Evaluation  

## 2013-11-05 NOTE — H&P (Addendum)
[name] [name] is a 23 y.o. female, G3P1011 at 36.4 weeks, presenting for fetal tachycardia and contractions.  Patient presented to office, this am, complaining of contractions every 10 min since 630am.  Patient denies LOF and VB, but reports decreased fetal movement and pelvic pain.  Patient assessed, VE 2/50/-2, then placed on NST which was reactive, but fetal tachycardia noted.  Patient directly admitted to antenatal for further assessment, including labs, complete US, IV fluids, and pain medication (prn). Patient recently treated for CT and TOC negative.   Patient Active Problem List   Diagnosis Date Noted  . Maternal care for fetal tachycardia during pregnancy 11/05/2013  . Missed abortion 12/28/2011  . Obesity 12/28/2011  . 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   EDC of 11/29/2013 was established by 5.2wk US on 03/31/2013.   Anatomy scan:  18.7 weeks, with right choroid plexus cysts found and an Anterior placenta.   Additional US evaluations:  Choroid plexus cysts f/u at 24.4wks on 08/13/13-resolved, Growth 60th% Significant prenatal events: +CT, +MJ, Dx of Schizoaffective D/O , ADHD, & PTSD Last evaluation:  11/05/2013  OB History   Grav Para Term Preterm Abortions TAB SAB Ect Mult Living   3 1  1 1  1   1      Past Medical History  Diagnosis Date  . History of hypertension no meds  . Left ureteral calculus     lithrotripsy  . Frequency of urination   . Urgency of urination   . Nocturia   . ADHD (attention deficit hyperactivity disorder)   . Depression   . Bipolar 1 disorder   . Anxiety   . Schizophrenia     MILD  . HTN in pregnancy, chronic     HX OF HTN; NO MEDS   Past Surgical History  Procedure Laterality Date  . Wisdom tooth extraction  2009- oral md office  . Cystoscopy/retrograde/ureteroscopy  08/25/2011    Procedure: CYSTOSCOPY/RETROGRADE/URETEROSCOPY;  Surgeon: Antony HasteMatthew Ramsey  Eskridge, MD;  [facility];  Service: Urology;  Laterality: Left;  cystoscopy LEFT retrograde pyelogram LEFT URETEROSCOPY holmium  LASER LITHO AND BASKET EXTRACTION C-ARM LASER  . Kidney stone surgery  07/2011    9 MM  . Dilation and evacuation  12/29/2011    Procedure: DILATATION AND EVACUATION;  Surgeon: Purcell NailsAngela Y Roberts, MD;  Location: [facility];  Service: Gynecology;  Laterality: N/A;   Family History: family history includes Anxiety disorder in her mother; Diabetes in her maternal grandfather; Heart disease in her maternal grandmother and paternal grandfather; Hypertension in her maternal grandmother; Other in her mother. Social History:  reports that she quit smoking about 1 years ago. Her smoking use included Cigarettes. She smoked 0.25 packs per day. She has never used smokeless tobacco. She reports that she does not drink alcohol or use illicit drugs.   Prenatal Transfer Tool  Maternal Diabetes: No Genetic Screening: Normal Maternal Ultrasounds/Referrals: Normal Fetal Ultrasounds or other Referrals:  None Maternal Substance Abuse:  Yes:  Type: Marijuana Significant Maternal Medications:  Meds include: Other: Lamictal Significant Maternal Lab Results: Lab values include: Group B Strep negative   ROS:  See HPI Above  No Known Allergies   Dilation: 3.5 Exam by:: Dr. Su Hiltoberts Blood pressure 141/89, pulse 105, temperature 101.2 F (38.4 C), temperature source Oral, resp. rate 20, height 5' 7.5" (1.715 m), weight 225 lb (102.059 kg), last menstrual period 02/16/2013.  Chest  clear Heart RRR without murmur Abd gravid, NT Pelvic: 2/50/-2 Ext: WNL  FHR: 152 UCs:  Irritability noted  Prenatal labs: ABO, Rh: --/--/A POS (04/22 1322) Antibody: NEG (04/22 1322) Rubella:   Immune RPR:   NR HBsAg:   Negative 04/18/13 HIV:   Negative GBS:  Negative  Sickle cell/Hgb electrophoresis:  N/A Pap:  Normal 05/14/2013 GC:  Negative Chlamydia:  Positive on  09/23/2013, Negative on 11/04/2013 Genetic screenings:  Negative Glucola:  Negative Other:  UDS-Positive for MJ 09/17/2013    Assessment IUP at 36.4wk Early Labor Decreased FM Fetal Tachycardia  Plan: Direct admit to antepartum per consult with Dr. Lance MorinA. Roberts Standard labs including CBC with differential GBS PCR/DNA IV fluid-500 bolus then continuous at 125mL US-Infant weight, AFI, presentation   1530 Patient with increase blood pressure. Dr. Lance MorinA. Roberts updated and plan as below: -PIH labs including P/C ratio -Urine drug screen  1650 Dr. Su Hiltoberts updated and plan as below: -Blood cultures -IV zofran for nausea -tylenol PO if able to tolerate, if not PR -Will come to assess patient   1530 O: SVE: 3-4 Temp: 101.2 FHR: 165 bpm, Mod Var, -Decels, +Accels UC: Q1-103min  A: Early Labor Chorioamnionitis Marijuana Usage  P: Per Dr. Su Hiltoberts: Admit to Labor & Delivery Pitocin Augmentation Start Unasyn SW Consult ordered   Peterson LombardJessica EmlyCNM, MSN 11/05/2013, 5:21 PM   Abdomen tender and low back tender as well.  No obvious CVAT.  Fetal tachycardia resolved with tylenol given in office.  Pt is somewhat tearful.  Exam changed to 3-4cm.  GBS neg.  P1 in early labor at 36 4/7 wks with fever, likely chorio.  Will augment labor and start unasyn.   Stadol now for pain and epidural prn.

## 2013-11-06 ENCOUNTER — Encounter (HOSPITAL_COMMUNITY): Payer: Self-pay | Admitting: *Deleted

## 2013-11-06 DIAGNOSIS — O411290 Chorioamnionitis, unspecified trimester, not applicable or unspecified: Secondary | ICD-10-CM

## 2013-11-06 HISTORY — DX: Chorioamnionitis, unspecified trimester, not applicable or unspecified: O41.1290

## 2013-11-06 LAB — CBC
HCT: 31.5 % — ABNORMAL LOW (ref 36.0–46.0)
Hemoglobin: 10.7 g/dL — ABNORMAL LOW (ref 12.0–15.0)
MCH: 32.5 pg (ref 26.0–34.0)
MCHC: 34 g/dL (ref 30.0–36.0)
MCV: 95.7 fL (ref 78.0–100.0)
Platelets: 148 10*3/uL — ABNORMAL LOW (ref 150–400)
RBC: 3.29 MIL/uL — ABNORMAL LOW (ref 3.87–5.11)
RDW: 13.1 % (ref 11.5–15.5)
WBC: 18.2 10*3/uL — AB (ref 4.0–10.5)

## 2013-11-06 MED ORDER — ZOLPIDEM TARTRATE 5 MG PO TABS: 5.0000 mg | ORAL_TABLET | Freq: Every evening | ORAL | Status: DC | PRN

## 2013-11-06 MED ORDER — SIMETHICONE 80 MG PO CHEW: 80.0000 mg | CHEWABLE_TABLET | ORAL | Status: DC | PRN

## 2013-11-06 MED ORDER — DIBUCAINE 1 % RE OINT
1.0000 | TOPICAL_OINTMENT | RECTAL | Status: DC | PRN
Start: 2013-11-06 — End: 2013-11-08

## 2013-11-06 MED ORDER — LAMOTRIGINE 100 MG PO TABS
100.0000 mg | ORAL_TABLET | Freq: Every day | ORAL | Status: DC
  Administered 2013-11-08: 100 mg via ORAL
  Filled 2013-11-06 (×4): qty 1

## 2013-11-06 MED ORDER — BENZOCAINE-MENTHOL 20-0.5 % EX AERO
1.0000 "application " | INHALATION_SPRAY | CUTANEOUS | Status: DC | PRN
  Administered 2013-11-06: 1 via TOPICAL
  Filled 2013-11-06: qty 56

## 2013-11-06 MED ORDER — FERROUS SULFATE 325 (65 FE) MG PO TABS
325.0000 mg | ORAL_TABLET | Freq: Every day | ORAL | Status: DC
  Filled 2013-11-06 (×2): qty 1

## 2013-11-06 MED ORDER — TETANUS-DIPHTH-ACELL PERTUSSIS 5-2.5-18.5 LF-MCG/0.5 IM SUSP: 0.5000 mL | Freq: Once | INTRAMUSCULAR | Status: DC

## 2013-11-06 MED ORDER — SENNOSIDES-DOCUSATE SODIUM 8.6-50 MG PO TABS
2.0000 | ORAL_TABLET | ORAL | Status: DC
  Filled 2013-11-06 (×2): qty 2

## 2013-11-06 MED ORDER — ONDANSETRON HCL 4 MG/2ML IJ SOLN: 4.0000 mg | INTRAMUSCULAR | Status: DC | PRN

## 2013-11-06 MED ORDER — DIPHENHYDRAMINE HCL 25 MG PO CAPS: 25.0000 mg | ORAL_CAPSULE | Freq: Four times a day (QID) | ORAL | Status: DC | PRN

## 2013-11-06 MED ORDER — IBUPROFEN 600 MG PO TABS
600.0000 mg | ORAL_TABLET | Freq: Four times a day (QID) | ORAL | Status: DC
  Administered 2013-11-06 – 2013-11-08 (×8): 600 mg via ORAL
  Filled 2013-11-06 (×8): qty 1

## 2013-11-06 MED ORDER — PRENATAL MULTIVITAMIN CH
1.0000 | ORAL_TABLET | Freq: Every day | ORAL | Status: DC
  Administered 2013-11-06: 1 via ORAL
  Filled 2013-11-06: qty 1

## 2013-11-06 MED ORDER — ONDANSETRON HCL 4 MG PO TABS: 4.0000 mg | ORAL_TABLET | ORAL | Status: DC | PRN

## 2013-11-06 MED ORDER — OXYCODONE-ACETAMINOPHEN 5-325 MG PO TABS
1.0000 | ORAL_TABLET | ORAL | Status: DC | PRN
  Administered 2013-11-06 (×3): 2 via ORAL
  Administered 2013-11-06: 1 via ORAL
  Administered 2013-11-07 – 2013-11-08 (×5): 2 via ORAL
  Filled 2013-11-06: qty 1
  Filled 2013-11-06 (×8): qty 2

## 2013-11-06 MED ORDER — WITCH HAZEL-GLYCERIN EX PADS: 1.0000 "application " | MEDICATED_PAD | CUTANEOUS | Status: DC | PRN

## 2013-11-06 MED ORDER — ACETAMINOPHEN 325 MG PO TABS: 650.0000 mg | ORAL_TABLET | Freq: Four times a day (QID) | ORAL | Status: DC | PRN

## 2013-11-06 MED ORDER — LANOLIN HYDROUS EX OINT: TOPICAL_OINTMENT | CUTANEOUS | Status: DC | PRN

## 2013-11-06 NOTE — Anesthesia Postprocedure Evaluation (Signed)
  Anesthesia Post-op Note  Patient: [name] [name]  Procedure(s) Performed: * No procedures listed *  Patient Location: Mother/Baby  Anesthesia Type:Epidural  Level of Consciousness: awake, alert , oriented and patient cooperative  Airway and Oxygen Therapy: Patient Spontanous Breathing  Post-op Pain: mild  Post-op Assessment: Patient's Cardiovascular Status Stable, Respiratory Function Stable, No headache, No backache, No residual numbness and No residual motor weakness  Post-op Vital Signs: stable  Last Vitals:  Filed Vitals:   11/06/13 0830  BP: 114/74  Pulse: 79  Temp: 36.6 C  Resp: 20    Complications: No apparent anesthesia complications

## 2013-11-06 NOTE — Lactation Note (Signed)
This note was copied from the chart of [name] [name]. Lactation Consultation Note  Patient Name: [name] [name] ZOXWR'UToday's Date: 11/06/2013 Reason for consult: Initial assessment;Late preterm infant  Mom stated infant has only fed few sucks since birth; 7814 hrs old.  Documentation reveals breastfed x2 ( 5 min and few sucks); voids-1; stools-0.  LPTI; 36.5 GA; 7 lbs, 8 oz.  Infant is very sleepy and difficult to arouse.  LC could get infant to take few sucks on gloved finger but weak suck (question whether d/t LPTI status or sleepy and not interested in sucking).  Mom has history of PTSD, Schizophrenia, Bipolar, and Smoking prior to pregnancy, and MJ use.  Mom is currently not on any regular medications for psych conditions but mom is very interested in breastfeeding and comprehends well.  Informed mom to check with MD about compatibility of any meds prescribed with breastfeeding.  LC assisted mom with feeding infant skin-to-skin but could only get infant to take a few sucks at breast before going to sleep.  #20 nipple shield started on mom to see if it would help stimulate infant to suck and remain latched; mom's nipples semi-flat.  Taught mom how to hand express; lots colostrum easily hand expressed.  Pre-filled nipple shield with colostrum and infant was biting shield with few sucks but could not get into a consistent pattern.  Spoon fed remaining colostrum to infant.  Mom had visitors and phone calls that kept interrupting breastfeeding teaching.  Upon leaving room, infant was asleep on mom's lap and encouraged mom to hand express more colostrum to feed to infant via spoon or pre-filled nipple shield via curved-tip syringe.  Mom stated she does not want to give bottle or formula; encouraged mom that she has lots colostrum that is pouring out with hand expression.  Reviewed with mom risks of LPTI status and the need to feed infant every few hours with lots skin-to-skin.  Taught size of infant's stomach  first day of life (~1035ml) and how to measure mls given on spoon and curved-tip syringe.  Gave mom hand pump and shells; taught how to use.  Lactation brochure given and informed of support group and outpatient services.  Encouraged mom to call for assistance if needed.  LC spoke with RN about plan of care and tools given to mom to feed EBM to infant if infant is too sleepy.     Maternal Data Formula Feeding for Exclusion: No Infant to breast within first hour of birth: Yes (few sucks) Has patient been taught Hand Expression?: Yes Does the patient have breastfeeding experience prior to this delivery?: Yes  Feeding Feeding Type: Breast Fed Length of feed: 1 min (few sucks)  LATCH Score/Interventions Latch: Repeated attempts needed to sustain latch, nipple held in mouth throughout feeding, stimulation needed to elicit sucking reflex. Intervention(s): Adjust position;Assist with latch;Breast compression  Audible Swallowing: A few with stimulation (prefilled nipple shield) Intervention(s): Skin to skin;Hand expression  Type of Nipple: Flat Intervention(s): Shells;Hand pump  Comfort (Breast/Nipple): Soft / non-tender     Hold (Positioning): Assistance needed to correctly position infant at breast and maintain latch. Intervention(s): Skin to skin;Support Pillows;Breastfeeding basics reviewed  LATCH Score: 6  Lactation Tools Discussed/Used Tools: Shells;Nipple Dorris CarnesShields;Pump Nipple shield size: 20 Shell Type: Inverted Breast pump type: Manual WIC Program: Yes Pump Review: Setup, frequency, and cleaning;Milk Storage Initiated by:: Mathews ArgyleStephanie Martavis Gurney, RN, IBCLC Date initiated:: 11/06/13   Consult Status Consult Status: Follow-up Date: 11/07/13 Follow-up type: In-patient  Claiborne RiggStephanie Walker Jacksen Isip 11/06/2013, 4:09 PM

## 2013-11-06 NOTE — Progress Notes (Signed)
[name] [name]  Post Partum Day 0:S/P SVD with 1st degree perineal laceration  Subjective: Patient up ad lib, denies syncope or dizziness. Patient reports pain "on ovary" that is not well controlled with 1 percocet. Patient denies issues with consuming regular diet Feeding: Breast Contraceptive plan:   Depo  Objective: Blood pressure 114/74, pulse 79, temperature 97.8 F (36.6 C), temperature source Oral, resp. rate 20, height 5' 7.5" (1.715 m), weight 225 lb (102.059 kg), last menstrual period 02/16/2013, SpO2 100.00%, unknown if currently breastfeeding.  Physical Exam:  General: alert, cooperative and no distress Lochia: appropriate Uterine Fundus: firm Incision: N/A DVT Evaluation: Not Assessed   Recent Labs  11/05/13 1322 11/06/13 0655  HGB 12.3 10.7*  HCT 36.2 31.5*    Assessment S/P SVD Breastfeeding Pain Asymptomatic Anemia  Plan: -Fe+ supplementation  -CBC in am to reassess -Increase percocet from 1 to 2 pills Q4hrs to manage pain -Continue current care  Gerrit HeckJessica Kealan Buchan, CNM 11/06/2013, 10:32 AM

## 2013-11-06 NOTE — Progress Notes (Signed)
UR chart review completed.  

## 2013-11-06 NOTE — Clinical Social Work Maternal (Addendum)
Clinical Social Work Department  PSYCHOSOCIAL ASSESSMENT - MATERNAL/CHILD  11/06/2013  Patient: [name] [name],[name] Account Number: [id number] Admit Date: 11/05/2013  Childs Name:  Luca [name] [name]   Clinical Social Worker: Miquel Stacks, LCSW Date/Time: 11/06/2013 02:50 PM  Date Referred: 11/06/2013  Referral source   CN    Referred reason   Behavioral Health Issues   Substance Abuse   Other referral source:  I: FAMILY / HOME ENVIRONMENT  Child's legal guardian: PARENT  Guardian - Name  Guardian - Age  Guardian - Address   Amiera [name] [name]  22  [address] [place], [place] [address]   Brandon Moore  23    Other household support members/support persons  Name  Relationship  DOB   Michael [name] [name]  SON  03/27/08   Other support:  II PSYCHOSOCIAL DATA  Information Source: Patient Interview  Financial and Community Resources  Employment:  Financial resources: Medicaid  If Medicaid - County: [place]  Other   Food Stamps   WIC   School / Grade:  Maternity Care Coordinator / Child Services Coordination / Early Interventions:  Shawnda Gainey   Cultural issues impacting care:  III STRENGTHS  Strengths   Adequate Resources   Home prepared for Child (including basic supplies)   Supportive family/friends   Strength comment:  IV RISK FACTORS AND CURRENT PROBLEMS  Current Problem: YES  Risk Factor & Current Problem  Patient Issue  Family Issue  Risk Factor / Current Problem Comment   Mental Illness  Y  N  Anxiety, Bipolar, Depression, ADD, Schizophrenia, PTSD   Substance Abuse  Y  N  Hx of MJ use   V SOCIAL WORK ASSESSMENT  CSW referral received to assess pts current social situation, mental health diagnoses & MJ use. Pt was accompanied by FOB when CSW entered the room however she gave this writer verbal permission to speak in her presence. Pt appears to have an extensive mental health history as chart review indicates that she was diagnosed with ADD at 23 years old, Depression at 23 years  old, Bipolar disorder at 23 years old & Anxiety at 23 years old. Most recently, pt sought treatment at [facility] in 2013. She was diagnosed with Schizophrenia (paranoid type), ADHD & "severe" bipolar disorder. Her symptoms were managed with Lamictal, Lithium, Strattera, Klonopin & Xanax until pregnancy confirmation. Pt was not able to cope well without medication & plans to follow up at [facility] one day next week to restart medication. She denies any SI/HI. In addition to services pts receives at [facility], she has seen a therapist for the past year & 3 months. Her therapist name is Mrs. Richardson who works at [facility] [facility] [employer] [facility] [employer] ([employer] She has a good relationship with her therapist & plans to continue treatment upon discharge. Pt was recently divorced 10/15/13 & seems happy to share. She has a son who is developmentally delayed & receives SSI benefits. Pt was previous history with Child Protective Services, (CPS) but denies that her son was ever removed from the home. This writer verified information with [place] CPS. Pt admits to smoking MJ at least 3 times during the pregnancy to help with appetite. Pt is positive on admission. CSW explained hospital drug testing policy & pt verbalized an understanding. UDS is positive, meconium results are pending. Pt does not want CPS involvement however accepts responsibility for her actions. The pt has all the necessary supplies for the infant. FOB was at the bedside & did not seem engaged in   information being discussed. CSW will make a CPS report. CSW strongly encourage pt to follow up at [facility] to address mental health needs upon discharge. CSW will continue to monitor drug screen results and make appropriate referrals.   VI SOCIAL WORK PLAN  Social Work Plan   Child Protective Services Report   No Further Intervention Required / No Barriers to Discharge   Type of pt/family education:  If child protective services report  - county: [place]  If child protective services report - date: 11/06/2013  Information/referral to community resources comment:  Other social work plan:     

## 2013-11-07 NOTE — Lactation Note (Addendum)
This note was copied from the chart of [name] [name]. Lactation Consultation Note  Patient Name: [name] [name] DVVOH'YToday's Date: 11/07/2013  Follow-up visit, baby 31 hours of age. Baby sleeping, dressed with mom.  Mom states that she has decided to pump and feed EBM to baby with bottle, and to supplement with formula as needed. Mom states that she did this with her first child as well. Mom states that baby is "only using her as a pacifier," and that she is more comfortable pumping and feeding with bottles, and that she has her own DEBP at home. Mom denies any breast/nipple pain. Mom is unhappy that she will not be discharged today because baby is not eating well. Attempted to review late pre-term infant behavior with mom, but [facility] stated [facility] already discussed and she understood.  [place] mom to pump and feed baby today and to call out for assistance as needed.  Maternal Data    Feeding Feeding Type: Bottle Fed - Formula Length of feed: 0 min  LATCH Score/Interventions Latch: Too sleepy or reluctant, no latch achieved, no sucking elicited. Intervention(s): Skin to skin Intervention(s): Adjust position;Assist with latch  Audible Swallowing: None Intervention(s): Skin to skin;Hand expression Intervention(s): Skin to skin;Hand expression  Type of Nipple: Flat Intervention(s): Shells;Hand pump  Comfort (Breast/Nipple): Soft / non-tender     Hold (Positioning): No assistance needed to correctly position infant at breast.  LATCH Score: 5  Lactation Tools Discussed/Used     Consult Status      Sherlyn HayJennifer D Lorenia Hoston 11/07/2013, 9:32 AM

## 2013-11-07 NOTE — Progress Notes (Signed)
[name] [name]  Post Partum Day 1:S/P SVD with 1st degree lac, Intrapartum chorio  Subjective: Patient up ad lib, denies syncope or dizziness. Feeding:  Breastfeeding and pumping, reported trouble with latching Contraceptive plan:   unknown  Objective: Blood pressure 122/75, pulse 65, temperature 97.7 F (36.5 C), temperature source Oral, resp. rate 18, height 5' 7.5" (1.715 m), weight 225 lb (102.059 kg), last menstrual period 02/16/2013, SpO2 100.00%, unknown if currently breastfeeding.  Physical Exam:  General: alert, cooperative and no distress Lochia: appropriate Uterine Fundus: firm Incision: healing well DVT Evaluation: No evidence of DVT seen on physical exam. Negative Homan's sign.   Recent Labs  11/05/13 1322 11/06/13 0655  HGB 12.3 10.7*  HCT 36.2 31.5*    Assessment S/P Vaginal delivery day 1 Asymptomatic anemia Breastfeeding - latching problems  Plan for discharge tomorrow  Plan: Continue Fe  Lactation consult today Continue current care Dr. Su Hiltoberts updated on patient status   [name] LawsJennifer Rameen Quinney, CNM 11/07/2013, 7:42 AM

## 2013-11-08 MED ORDER — IBUPROFEN 600 MG PO TABS: 600.0000 mg | ORAL_TABLET | Freq: Four times a day (QID) | ORAL | Status: DC

## 2013-11-08 MED ORDER — PRENATAL MULTIVITAMIN CH: 1.0000 | ORAL_TABLET | Freq: Every day | ORAL | Status: DC

## 2013-11-08 MED ORDER — MEDROXYPROGESTERONE ACETATE 150 MG/ML IM SUSP
150.0000 mg | Freq: Once | INTRAMUSCULAR | Status: AC
  Administered 2013-11-08: 150 mg via INTRAMUSCULAR
  Filled 2013-11-08: qty 1

## 2013-11-08 MED ORDER — FERROUS SULFATE 325 (65 FE) MG PO TABS: 325.0000 mg | ORAL_TABLET | Freq: Every day | ORAL | Status: DC

## 2013-11-08 NOTE — Discharge Summary (Signed)
Vaginal Delivery Discharge Summary  [name] [name]  DOB:    05/15/1991 MRN:    [id number] CSN:    [id number]  Date of admission:                  11/05/13  Date of discharge:                   11/08/13  Procedures this admission: SVD with 1st degree laceration  Date of Delivery: 11/06/13 by Nigel Bridgeman  Newborn Data:  Live born female  Birth Weight: 7 lb 8.1 oz (3405 g) APGAR: 9, 9  Home with mother. Name: "[name] [name]" Circumcision Plan: Office  History of Present Illness:  [name] [name] is a 23 y.o. female, [phone], who presents at [redacted]w[redacted]d weeks gestation. The patient has been followed at the [facility] of [facility]. She was admitted onset of labor and ?chorio. Her pregnancy has been complicated by:  Patient Active Problem List   Diagnosis Date Noted  . 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 Vaginal delivery 11/06/2013  . Schizoaffective disorder 11/05/2013  . Hx of pre-eclampsia in prior pregnancy, currently pregnant 11/05/2013  . History of posttraumatic stress disorder (PTSD) 11/05/2013  . 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 Hx Renal stones 11/05/2013  . Missed abortion 12/28/2011  . Obesity 12/28/2011  . 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   Her labor was complicated. She proceeded to have a vaginal delivery of a healthy infant. Her delivery was not complicated. Her postpartum course was not complicated.  She was discharged to home on postpartum day 2 doing well.  Feeding:  breast  Contraception:  Depo-Provera at discharge, will discuss future possible methods at [facility] appointment  Discharge hemoglobin:  Hemoglobin  Date Value Ref Range Status  11/06/2013 10.7* 12.0 - 15.0 g/dL Final     HCT  Date Value Ref Range Status  11/06/2013 31.5* 36.0 - 46.0 % Final    Discharge Physical Exam:   General: alert, cooperative and no  distress Lochia: appropriate Uterine Fundus: firm Incision: healing well DVT Evaluation: No evidence of DVT seen on physical exam. Negative Homan's sign.  Intrapartum Procedures: spontaneous vaginal delivery and ABX therapy Postpartum Procedures: Continued ABX therapy Complications-Operative and Postpartum: 1st degree perineal laceration CPS and CSW involved  Discharge Diagnoses: Term Pregnancy-delivered and Amnionitis, anemia  Discharge Information:  Activity:           pelvic rest Diet:                routine Medications: PNV, Ibuprofen and Iron Condition:      stable Instructions:   Postpartum Care After Vaginal Delivery  After you deliver your newborn (postpartum period), the usual stay in the hospital is 24 72 hours. If there were problems with your labor or delivery, or if you have other medical problems, you might be in the hospital longer.  While you are in the hospital, you will receive help and instructions on how to care for yourself and your newborn during the postpartum period.  While you are in the hospital:  Be sure to tell your nurses if you have pain or discomfort, as well as where you feel the pain and what makes the pain worse.  If you had an incision made near your vagina (episiotomy) or if you had some tearing during delivery, the nurses may put ice packs on  your episiotomy or tear. The ice packs may help to reduce the pain and swelling.  If you are breastfeeding, you may feel uncomfortable contractions of your uterus for a couple of weeks. This is normal. The contractions help your uterus get back to normal size.  It is normal to have some bleeding after delivery.  For the first 1 3 days after delivery, the flow is red and the amount may be similar to a period.  It is common for the flow to start and stop.  In the first few days, you may pass some small clots. Let your nurses know if you begin to pass large clots or your flow increases.  Do not  flush  blood clots down the toilet before having the nurse look at them.  During the next 3 10 days after delivery, your flow should become more watery and pink or brown-tinged in color.  Ten to fourteen days after delivery, your flow should be a small amount of yellowish-white discharge.  The amount of your flow will decrease over the first few weeks after delivery. Your flow may stop in 6 8 weeks. Most women have had their flow stop by 12 weeks after delivery.  You should change your sanitary pads frequently.  Wash your hands thoroughly with soap and water for at least 20 seconds after changing pads, using the toilet, or before holding or feeding your newborn.  You should feel like you need to empty your bladder within the first 6 8 hours after delivery.  In case you become weak, lightheaded, or faint, call your nurse before you get out of bed for the first time and before you take a shower for the first time.  Within the first few days after delivery, your breasts may begin to feel tender and full. This is called engorgement. Breast tenderness usually goes away within 48 72 hours after engorgement occurs. You may also notice milk leaking from your breasts. If you are not breastfeeding, do not stimulate your breasts. Breast stimulation can make your breasts produce more milk.  Spending as much time as possible with your newborn is very important. During this time, you and your newborn can feel close and get to know each other. Having your newborn stay in your room (rooming in) will help to strengthen the bond with your newborn. It will give you time to get to know your newborn and become comfortable caring for your newborn.  Your hormones change after delivery. Sometimes the hormone changes can temporarily cause you to feel sad or tearful. These feelings should not last more than a few days. If these feelings last longer than that, you should talk to your caregiver.  If desired, talk to your  caregiver about methods of family planning or contraception.  Talk to your caregiver about immunizations. Your caregiver may want you to have the following immunizations before leaving the hospital:  Tetanus, diphtheria, and pertussis (Tdap) or tetanus and diphtheria (Td) immunization. It is very important that you and your family (including grandparents) or others caring for your newborn are up-to-date with the Tdap or Td immunizations. The Tdap or Td immunization can help protect your newborn from getting ill.  Rubella immunization.  Varicella (chickenpox) immunization.  Influenza immunization. You should receive this annual immunization if you did not receive the immunization during your pregnancy. Document Released: 04/30/2007 Document Revised: 03/27/2012 Document Reviewed: 02/28/2012 [facility] Patient Information 2014 [place], [place].   Postpartum Depression and Baby Blues  The postpartum period begins  right after the birth of a baby. During this time, there is often a great amount of joy and excitement. It is also a time of considerable changes in the life of the parent(s). Regardless of how many times a mother gives birth, each child brings new challenges and dynamics to the family. It is not unusual to have feelings of excitement accompanied by confusing shifts in moods, emotions, and thoughts. All mothers are at risk of developing postpartum depression or the "baby blues." These mood changes can occur right after giving birth, or they may occur many months after giving birth. The baby blues or postpartum depression can be mild or severe. Additionally, postpartum depression can resolve rather quickly, or it can be a long-term condition. CAUSES Elevated hormones and their rapid decline are thought to be a main cause of postpartum depression and the baby blues. There are a number of hormones that radically change during and after pregnancy. Estrogen and progesterone usually decrease  immediately after delivering your baby. The level of thyroid hormone and various cortisol steroids also rapidly drop. Other factors that play a major role in these changes include major life events and genetics.  RISK FACTORS If you have any of the following risks for the baby blues or postpartum depression, know what symptoms to watch out for during the postpartum period. Risk factors that may increase the likelihood of getting the baby blues or postpartum depression include:  Havinga personal or family history of depression.  Having depression while being pregnant.  Having premenstrual or oral contraceptive-associated mood issues.  Having exceptional life stress.  Having marital conflict.  Lacking a social support network.  Having a baby with special needs.  Having health problems such as diabetes. SYMPTOMS Baby blues symptoms include:  Brief fluctuations in mood, such as going from extreme happiness to sadness.  Decreased concentration.  Difficulty sleeping.  Crying spells, tearfulness.  Irritability.  Anxiety. Postpartum depression symptoms typically begin within the first month after giving birth. These symptoms include:  Difficulty sleeping or excessive sleepiness.  Marked weight loss.  Agitation.  Feelings of worthlessness.  Lack of interest in activity or food. Postpartum psychosis is a very concerning condition and can be dangerous. Fortunately, it is rare. Displaying any of the following symptoms is cause for immediate medical attention. Postpartum psychosis symptoms include:  Hallucinations and delusions.  Bizarre or disorganized behavior.  Confusion or disorientation. DIAGNOSIS  A diagnosis is made by an evaluation of your symptoms. There are no medical or lab tests that lead to a diagnosis, but there are various questionnaires that a caregiver may use to identify those with the baby blues, postpartum depression, or psychosis. Often times, a screening  tool called the [place] Postnatal Depression Scale is used to diagnose depression in the postpartum period.  TREATMENT The baby blues usually goes away on its own in 1 to 2 weeks. Social support is often all that is needed. You should be encouraged to get adequate sleep and rest. Occasionally, you may be given medicines to help you sleep.  Postpartum depression requires treatment as it can last several months or longer if it is not treated. Treatment may include individual or group therapy, medicine, or both to address any social, physiological, and psychological factors that may play a role in the depression. Regular exercise, a healthy diet, rest, and social support may also be strongly recommended.  Postpartum psychosis is more serious and needs treatment right away. Hospitalization is often needed. HOME CARE INSTRUCTIONS  Get as  much rest as you can. Nap when the baby sleeps.  Exercise regularly. Some women find yoga and walking to be beneficial.  Eat a balanced and nourishing diet.  Do little things that you enjoy. Have a cup of tea, take a bubble bath, read your favorite magazine, or listen to your favorite music.  Avoid alcohol.  Ask for help with household chores, cooking, grocery shopping, or running errands as needed. Do not try to do everything.  Talk to people close to you about how you are feeling. Get support from your partner, family members, friends, or other new moms.  Try to stay positive in how you think. Think about the things you are grateful for.  Do not spend a lot of time alone.  Only take medicine as directed by your caregiver.  Keep all your postpartum appointments.  Let your caregiver know if you have any concerns. SEEK MEDICAL CARE IF: You are having a reaction or problems with your medicine. SEEK IMMEDIATE MEDICAL CARE IF:  You have suicidal feelings.  You feel you may harm the baby or someone else. Document Released: 04/06/2004 Document Revised:  09/25/2011 Document Reviewed: 05/09/2011 [facility] Patient Information 2014 [place], [place].   Discharge to: home     [name] LawsJennifer Charelle [name] 11/08/2013

## 2013-11-08 NOTE — Progress Notes (Signed)
FOB contacted CSW T. Javier Glazier voicing concerns that mother was "sniffing pills, smoking marijuana daily and using alcohol".    Another report was made to DSS.   Report was taken by Demetrius Charity.  Informed that there is an open case with DSS and the caseworker will follow up with mother in the community.  Spoke with mother and informed that she has met with Saralyn Pilar, Child and Family Service caseworker and she has an appointment to meet her at her home today at 3pm.  Mother has a copy of the safety plan, and a copy was placed in newborn's chart. Plan is for mother and baby to return home and DSS will follow up in the community.  RN caring for patient informed.

## 2013-11-08 NOTE — Discharge Instructions (Signed)
Postpartum Care After Vaginal Delivery °After you deliver your newborn (postpartum period), the usual stay in the hospital is 24 72 hours. If there were problems with your labor or delivery, or if you have other medical problems, you might be in the hospital longer.  °While you are in the hospital, you will receive help and instructions on how to care for yourself and your newborn during the postpartum period.  °While you are in the hospital: °· Be sure to tell your nurses if you have pain or discomfort, as well as where you feel the pain and what makes the pain worse. °· If you had an incision made near your vagina (episiotomy) or if you had some tearing during delivery, the nurses may put ice packs on your episiotomy or tear. The ice packs may help to reduce the pain and swelling. °· If you are breastfeeding, you may feel uncomfortable contractions of your uterus for a couple of weeks. This is normal. The contractions help your uterus get back to normal size. °· It is normal to have some bleeding after delivery. °· For the first 1 3 days after delivery, the flow is red and the amount may be similar to a period. °· It is common for the flow to start and stop. °· In the first few days, you may pass some small clots. Let your nurses know if you begin to pass large clots or your flow increases. °· Do not  flush blood clots down the toilet before having the nurse look at them. °· During the next 3 10 days after delivery, your flow should become more watery and pink or brown-tinged in color. °· Ten to fourteen days after delivery, your flow should be a small amount of yellowish-white discharge. °· The amount of your flow will decrease over the first few weeks after delivery. Your flow may stop in 6 8 weeks. Most women have had their flow stop by 12 weeks after delivery. °· You should change your sanitary pads frequently. °· Wash your hands thoroughly with soap and water for at least 20 seconds after changing pads, using  the toilet, or before holding or feeding your newborn. °· You should feel like you need to empty your bladder within the first 6 8 hours after delivery. °· In case you become weak, lightheaded, or faint, call your nurse before you get out of bed for the first time and before you take a shower for the first time. °· Within the first few days after delivery, your breasts may begin to feel tender and full. This is called engorgement. Breast tenderness usually goes away within 48 72 hours after engorgement occurs. You may also notice milk leaking from your breasts. If you are not breastfeeding, do not stimulate your breasts. Breast stimulation can make your breasts produce more milk. °· Spending as much time as possible with your newborn is very important. During this time, you and your newborn can feel close and get to know each other. Having your newborn stay in your room (rooming in) will help to strengthen the bond with your newborn.  It will give you time to get to know your newborn and become comfortable caring for your newborn. °· Your hormones change after delivery. Sometimes the hormone changes can temporarily cause you to feel sad or tearful. These feelings should not last more than a few days. If these feelings last longer than that, you should talk to your caregiver. °· If desired, talk to your caregiver about   methods of family planning or contraception.  Talk to your caregiver about immunizations. Your caregiver may want you to have the following immunizations before leaving the hospital:  Tetanus, diphtheria, and pertussis (Tdap) or tetanus and diphtheria (Td) immunization. It is very important that you and your family (including grandparents) or others caring for your newborn are up-to-date with the Tdap or Td immunizations. The Tdap or Td immunization can help protect your newborn from getting ill.  Rubella immunization.  Varicella (chickenpox) immunization.  Influenza immunization. You should  receive this annual immunization if you did not receive the immunization during your pregnancy. Document Released: 04/30/2007 Document Revised: 03/27/2012 Document Reviewed: 02/28/2012 [facility] Patient Information 2014 [place].  Postpartum Depression and Baby Blues The postpartum period begins right after the birth of a baby. During this time, there is often a great amount of joy and excitement. It is also a time of considerable changes in the life of the parent(s). Regardless of how many times a mother gives birth, each child brings new challenges and dynamics to the family. It is not unusual to have feelings of excitement accompanied by confusing shifts in moods, emotions, and thoughts. All mothers are at risk of developing postpartum depression or the "baby blues." These mood changes can occur right after giving birth, or they may occur many months after giving birth. The baby blues or postpartum depression can be mild or severe. Additionally, postpartum depression can resolve rather quickly, or it can be a long-term condition. CAUSES Elevated hormones and their rapid decline are thought to be a main cause of postpartum depression and the baby blues. There are a number of hormones that radically change during and after pregnancy. Estrogen and progesterone usually decrease immediately after delivering your baby. The level of thyroid hormone and various cortisol steroids also rapidly drop. Other factors that play a major role in these changes include major life events and genetics.  RISK FACTORS If you have any of the following risks for the baby blues or postpartum depression, know what symptoms to watch out for during the postpartum period. Risk factors that may increase the likelihood of getting the baby blues or postpartum depression include:  Havinga personal or family history of depression.  Having depression while being pregnant.  Having premenstrual or oral contraceptive-associated  mood issues.  Having exceptional life stress.  Having marital conflict.  Lacking a social support network.  Having a baby with special needs.  Having health problems such as diabetes. SYMPTOMS Baby blues symptoms include:  Brief fluctuations in mood, such as going from extreme happiness to sadness.  Decreased concentration.  Difficulty sleeping.  Crying spells, tearfulness.  Irritability.  Anxiety. Postpartum depression symptoms typically begin within the first month after giving birth. These symptoms include:  Difficulty sleeping or excessive sleepiness.  Marked weight loss.  Agitation.  Feelings of worthlessness.  Lack of interest in activity or food. Postpartum psychosis is a very concerning condition and can be dangerous. Fortunately, it is rare. Displaying any of the following symptoms is cause for immediate medical attention. Postpartum psychosis symptoms include:  Hallucinations and delusions.  Bizarre or disorganized behavior.  Confusion or disorientation. DIAGNOSIS  A diagnosis is made by an evaluation of your symptoms. There are no medical or lab tests that lead to a diagnosis, but there are various questionnaires that a caregiver may use to identify those with the baby blues, postpartum depression, or psychosis. Often times, a screening tool called the [place] Postnatal Depression Scale is used to diagnose  depression in the postpartum period.  °TREATMENT °The baby blues usually goes away on its own in 1 to 2 weeks. Social support is often all that is needed. You should be encouraged to get adequate sleep and rest. Occasionally, you may be given medicines to help you sleep.  °Postpartum depression requires treatment as it can last several months or longer if it is not treated. Treatment may include individual or group therapy, medicine, or both to address any social, physiological, and psychological factors that may play a role in the depression. Regular  exercise, a healthy diet, rest, and social support may also be strongly recommended.  °Postpartum psychosis is more serious and needs treatment right away. Hospitalization is often needed. °HOME CARE INSTRUCTIONS °· Get as much rest as you can. Nap when the baby sleeps. °· Exercise regularly. Some women find yoga and walking to be beneficial. °· Eat a balanced and nourishing diet. °· Do little things that you enjoy. Have a cup of tea, take a bubble bath, read your favorite magazine, or listen to your favorite music. °· Avoid alcohol. °· Ask for help with household chores, cooking, grocery shopping, or running errands as needed. Do not try to do everything. °· Talk to people close to you about how you are feeling. Get support from your partner, family members, friends, or other new moms. °· Try to stay positive in how you think. Think about the things you are grateful for. °· Do not spend a lot of time alone. °· Only take medicine as directed by your caregiver. °· Keep all your postpartum appointments. °· Let your caregiver know if you have any concerns. °SEEK MEDICAL CARE IF: °You are having a reaction or problems with your medicine. °SEEK IMMEDIATE MEDICAL CARE IF: °· You have suicidal feelings. °· You feel you may harm the baby or someone else. °Document Released: 04/06/2004 Document Revised: 09/25/2011 Document Reviewed: 04/14/2013 °[employer]® Patient Information ©2014 [employer]. ° °Breastfeeding °Deciding to breastfeed is one of the best choices you can make for you and your baby. A change in hormones during pregnancy causes your breast tissue to grow and increases the number and size of your milk ducts. These hormones also allow proteins, sugars, and fats from your blood supply to make breast milk in your milk-producing glands. Hormones prevent breast milk from being released before your baby is born as well as prompt milk flow after birth. Once breastfeeding has begun, thoughts of your baby, as well as his  or her sucking or crying, can stimulate the release of milk from your milk-producing glands.  °BENEFITS OF BREASTFEEDING °For Your Baby °· Your first milk (colostrum) helps your baby's digestive system function better.   °· There are antibodies in your milk that help your baby fight off infections.   °· Your baby has a lower incidence of asthma, allergies, and sudden infant death syndrome.   °· The nutrients in breast milk are better for your baby than infant formulas and are designed uniquely for your baby's needs.   °· Breast milk improves your baby's brain development.   °· Your baby is less likely to develop other conditions, such as childhood obesity, asthma, or type 2 diabetes mellitus.   °For You  °· Breastfeeding helps to create a very special bond between you and your baby.   °· Breastfeeding is convenient. Breast milk is always available at the correct temperature and costs nothing.   °· Breastfeeding helps to burn calories and helps you lose the weight gained during pregnancy.   °· Breastfeeding makes   your uterus contract to its prepregnancy size faster and slows bleeding (lochia) after you give birth.   Breastfeeding helps to lower your risk of developing type 2 diabetes mellitus, osteoporosis, and breast or ovarian cancer later in life. SIGNS THAT YOUR BABY IS HUNGRY Early Signs of Hunger  Increased alertness or activity.  Stretching.  Movement of the head from side to side.  Movement of the head and opening of the mouth when the corner of the mouth or cheek is stroked (rooting).  Increased sucking sounds, smacking lips, cooing, sighing, or squeaking.  Hand-to-mouth movements.  Increased sucking of fingers or hands. Late Signs of Hunger  Fussing.  Intermittent crying. Extreme Signs of Hunger Signs of extreme hunger will require calming and consoling before your baby will be able to breastfeed successfully. Do not wait for the following signs of extreme hunger to occur before  you initiate breastfeeding:   Restlessness.  A loud, strong cry.   Screaming. BREASTFEEDING BASICS Breastfeeding Initiation  Find a comfortable place to sit or lie down, with your neck and back well supported.  Place a pillow or rolled up blanket under your baby to bring him or her to the level of your breast (if you are seated). Nursing pillows are specially designed to help support your arms and your baby while you breastfeed.  Make sure that your baby's abdomen is facing your abdomen.   Gently massage your breast. With your fingertips, massage from your chest wall toward your nipple in a circular motion. This encourages milk flow. You may need to continue this action during the feeding if your milk flows slowly.  Support your breast with 4 fingers underneath and your thumb above your nipple. Make sure your fingers are well away from your nipple and your baby's mouth.   Stroke your baby's lips gently with your finger or nipple.   When your baby's mouth is open wide enough, quickly bring your baby to your breast, placing your entire nipple and as much of the colored area around your nipple (areola) as possible into your baby's mouth.   More areola should be visible above your baby's upper lip than below the lower lip.   Your baby's tongue should be between his or her lower gum and your breast.   Ensure that your baby's mouth is correctly positioned around your nipple (latched). Your baby's lips should create a seal on your breast and be turned out (everted).  It is common for your baby to suck about 2 3 minutes in order to start the flow of breast milk. Latching Teaching your baby how to latch on to your breast properly is very important. An improper latch can cause nipple pain and decreased milk supply for you and poor weight gain in your baby. Also, if your baby is not latched onto your nipple properly, he or she may swallow some air during feeding. This can make your baby  fussy. Burping your baby when you switch breasts during the feeding can help to get rid of the air. However, teaching your baby to latch on properly is still the best way to prevent fussiness from swallowing air while breastfeeding. Signs that your baby has successfully latched on to your nipple:    Silent tugging or silent sucking, without causing you pain.   Swallowing heard between every 3 4 sucks.    Muscle movement above and in front of his or her ears while sucking.  Signs that your baby has not successfully latched on  to nipple:   Sucking sounds or smacking sounds from your baby while breastfeeding.  Nipple pain. If you think your baby has not latched on correctly, slip your finger into the corner of your baby's mouth to break the suction and place it between your baby's gums. Attempt breastfeeding initiation again. Signs of Successful Breastfeeding Signs from your baby:   A gradual decrease in the number of sucks or complete cessation of sucking.   Falling asleep.   Relaxation of his or her body.   Retention of a small amount of milk in his or her mouth.   Letting go of your breast by himself or herself. Signs from you:  Breasts that have increased in firmness, weight, and size 1 3 hours after feeding.   Breasts that are softer immediately after breastfeeding.  Increased milk volume, as well as a change in milk consistency and color by the 5th day of breastfeeding.   Nipples that are not sore, cracked, or bleeding. Signs That Your [employer] is Getting Enough Milk  Wetting at least 3 diapers in a 24-hour period. The urine should be clear and pale yellow by age 511 days.  At least 3 stools in a 24-hour period by age 511 days. The stool should be soft and yellow.  At least 3 stools in a 24-hour period by age 27 days. The stool should be seedy and yellow.  No loss of weight greater than 10% of birth weight during the first 25 days of age.  Average weight gain of 4 7  ounces (120 210 mL) per week after age 51 days.  Consistent daily weight gain by age 8 days, without weight loss after the age of 2 weeks. After a feeding, your baby may spit up a small amount. This is common. BREASTFEEDING FREQUENCY AND DURATION Frequent feeding will help you make more milk and can prevent sore nipples and breast engorgement. Breastfeed when you feel the need to reduce the fullness of your breasts or when your baby shows signs of hunger. This is called "breastfeeding on demand." Avoid introducing a pacifier to your baby while you are working to establish breastfeeding (the first 4 6 weeks after your baby is born). After this time you may choose to use a pacifier. Research has shown that pacifier use during the first year of a baby's life decreases the risk of sudden infant death syndrome (SIDS). Allow your baby to feed on each breast as long as he or she wants. Breastfeed until your baby is finished feeding. When your baby unlatches or falls asleep while feeding from the first breast, offer the second breast. Because newborns are often sleepy in the first few weeks of life, you may need to awaken your baby to get him or her to feed. Breastfeeding times will vary from baby to baby. However, the following rules can serve as a guide to help you ensure that your baby is properly fed:  Newborns (babies 57 weeks of age or younger) may breastfeed every 1 3 hours.  Newborns should not go longer than 3 hours during the day or 5 hours during the night without breastfeeding.  You should breastfeed your baby a minimum of 8 times in a 24-hour period until you begin to introduce solid foods to your baby at around 87 months of age. BREAST MILK PUMPING Pumping and storing breast milk allows you to ensure that your baby is exclusively fed your breast milk, even at times when you are unable to breastfeed. This  is especially important if you are going back to work while you are still breastfeeding or when  you are not able to be present during feedings. Your lactation consultant can give you guidelines on how long it is safe to store breast milk.  A breast pump is a machine that allows you to pump milk from your breast into a sterile bottle. The pumped breast milk can then be stored in a refrigerator or freezer. Some breast pumps are operated by hand, while others use electricity. Ask your lactation consultant which type will work best for you. Breast pumps can be purchased, but some hospitals and breastfeeding support groups lease breast pumps on a monthly basis. A lactation consultant can teach you how to hand express breast milk, if you prefer not to use a pump.  CARING FOR YOUR BREASTS WHILE YOU BREASTFEED Nipples can become dry, cracked, and sore while breastfeeding. The following recommendations can help keep your breasts moisturized and healthy:  Avoid using soap on your nipples.   Wear a supportive bra. Although not required, special nursing bras and tank tops are designed to allow access to your breasts for breastfeeding without taking off your entire bra or top. Avoid wearing underwire style bras or extremely tight bras.  Air dry your nipples for 3 5mnutes after each feeding.   Use only cotton bra pads to absorb leaked breast milk. Leaking of breast milk between feedings is normal.   Use lanolin on your nipples after breastfeeding. Lanolin helps to maintain your skin's normal moisture barrier. If you use pure lanolin you do not need to wash it off before feeding your baby again. Pure lanolin is not toxic to your baby. You may also hand express a few drops of breast milk and gently massage that milk into your nipples and allow the milk to air dry. In the first few weeks after giving birth, some women experience extremely full breasts (engorgement). Engorgement can make your breasts feel heavy, warm, and tender to the touch. Engorgement peaks within 3 5 days after you give birth. The  following recommendations can help ease engorgement:  Completely empty your breasts while breastfeeding or pumping. You may want to start by applying warm, moist heat (in the shower or with warm water-soaked hand towels) just before feeding or pumping. This increases circulation and helps the milk flow. If your baby does not completely empty your breasts while breastfeeding, pump any extra milk after he or she is finished.  Wear a snug bra (nursing or regular) or tank top for 1 2 days to signal your body to slightly decrease milk production.  Apply ice packs to your breasts, unless this is too uncomfortable for you.  Make sure that your baby is latched on and positioned properly while breastfeeding. If engorgement persists after 48 hours of following these recommendations, contact your health care provider or a lScience writer OVERALL HEALTH CARE RECOMMENDATIONS WHILE BREASTFEEDING  Eat healthy foods. Alternate between meals and snacks, eating 3 of each per day. Because what you eat affects your breast milk, some of the foods may make your baby more irritable than usual. Avoid eating these foods if you are sure that they are negatively affecting your baby.  Drink milk, fruit juice, and water to satisfy your thirst (about 10 glasses a day).   Rest often, relax, and continue to take your prenatal vitamins to prevent fatigue, stress, and anemia.  Continue breast self-awareness checks.  Avoid chewing and smoking tobacco.  Avoid alcohol and  drug use. Some medicines that may be harmful to your baby can pass through breast milk. It is important to ask your health care provider before taking any medicine, including all over-the-counter and prescription medicine as well as vitamin and herbal supplements. It is possible to become pregnant while breastfeeding. If birth control is desired, ask your health care provider about options that will be safe for your baby. SEEK MEDICAL CARE IF:   You  feel like you want to stop breastfeeding or have become frustrated with breastfeeding.  You have painful breasts or nipples.  Your nipples are cracked or bleeding.  Your breasts are red, tender, or warm.  You have a swollen area on either breast.  You have a fever or chills.  You have nausea or vomiting.  You have drainage other than breast milk from your nipples.  Your breasts do not become full before feedings by the 5th day after you give birth.  You feel sad and depressed.  Your baby is too sleepy to eat well.  Your baby is having trouble sleeping.   Your baby is wetting less than 3 diapers in a 24-hour period.  Your baby has less than 3 stools in a 24-hour period.  Your baby's skin or the white part of his or her eyes becomes yellow.   Your baby is not gaining weight by 84 days of age. SEEK IMMEDIATE MEDICAL CARE IF:   Your baby is overly tired (lethargic) and does not want to wake up and feed.  Your baby develops an unexplained fever. Document Released: 07/03/2005 Document Revised: 03/05/2013 Document Reviewed: 12/25/2012 [facility] Patient Information 2014 [place].  Iron-Rich Diet An iron-rich diet contains foods that are good sources of iron. Iron is an important mineral that helps your body produce hemoglobin. Hemoglobin is a protein in red blood cells that carries oxygen to the body's tissues. Sometimes, the iron level in your blood can be low. This may be caused by:  A lack of iron in your diet.  Blood loss.  Times of growth, such as during pregnancy or during a child's growth and development. Low levels of iron can cause a decrease in the number of red blood cells. This can result in iron deficiency anemia. Iron deficiency anemia symptoms include:  Tiredness.  Weakness.  Irritability.  Increased chance of infection. Here are some recommendations for daily iron intake:  Males older than 24 years of age need 8 mg of iron per day.  Women  ages 66 to 75 need 18 mg of iron per day.  Pregnant women need 27 mg of iron per day, and women who are over 66 years of age and breastfeeding need 9 mg of iron per day.  Women over the age of 60 need 8 mg of iron per day. SOURCES OF IRON There are 2 types of iron that are found in food: heme iron and nonheme iron. Heme iron is absorbed by the body better than nonheme iron. Heme iron is found in meat, poultry, and fish. Nonheme iron is found in grains, beans, and vegetables. Heme Iron Sources Food / Iron (mg)  Chicken liver, 3 oz (85 g)/ 10 mg  Beef liver, 3 oz (85 g)/ 5.5 mg  Oysters, 3 oz (85 g)/ 8 mg  Beef, 3 oz (85 g)/ 2 to 3 mg  Shrimp, 3 oz (85 g)/ 2.8 mg  [place], 3 oz (85 g)/ 2 mg  Chicken, 3 oz (85 g) / 1 mg  Fish (tuna, halibut),  3 oz (85 g)/ 1 mg  Pork, 3 oz (85 g)/ 0.9 mg Nonheme Iron Sources Food / Iron (mg)  Ready-to-eat breakfast cereal, iron-fortified / 3.9 to 7 mg  Tofu,  cup / 3.4 mg  Kidney beans,  cup / 2.6 mg  Baked potato with skin / 2.7 mg  Asparagus,  cup / 2.2 mg  Avocado / 2 mg  Dried peaches,  cup / 1.6 mg  Raisins,  cup / 1.5 mg  Soy milk, 1 cup / 1.5 mg  Whole-wheat bread, 1 slice / 1.2 mg  Spinach, 1 cup / 0.8 mg  Broccoli,  cup / 0.6 mg IRON ABSORPTION Certain foods can decrease the body's absorption of iron. Try to avoid these foods and beverages while eating meals with iron-containing foods:  Coffee.  Tea.  Fiber.  Soy. Foods containing vitamin C can help increase the amount of iron your body absorbs from iron sources, especially from nonheme sources. Eat foods with vitamin C along with iron-containing foods to increase your iron absorption. Foods that are high in vitamin C include many fruits and vegetables. Some good sources are:  Fresh orange juice.  Oranges.  Strawberries.  Mangoes.  Grapefruit.  Red bell peppers.  Green bell peppers.  Broccoli.  Potatoes with skin.  Tomato juice. Document  Released: 02/14/2005 Document Revised: 09/25/2011 Document Reviewed: 12/22/2010 [facility] Patient Information 2014 [place] [place], [place].  Contraception Choices Contraception (birth control) is the use of any methods or devices to prevent pregnancy. Below are some methods to help avoid pregnancy. HORMONAL METHODS   Contraceptive implant This is a thin, plastic tube containing progesterone hormone. It does not contain estrogen hormone. Your health care provider inserts the tube in the inner part of the upper arm. The tube can remain in place for up to 3 years. After 3 years, the implant must be removed. The implant prevents the ovaries from releasing an egg (ovulation), thickens the cervical mucus to prevent sperm from entering the uterus, and thins the lining of the inside of the uterus.  Progesterone-only injections These injections are given every 3 months by your health care provider to prevent pregnancy. This synthetic progesterone hormone stops the ovaries from releasing eggs. It also thickens cervical mucus and changes the uterine lining. This makes it harder for sperm to survive in the uterus.  Birth control pills These pills contain estrogen and progesterone hormone. They work by preventing the ovaries from releasing eggs (ovulation). They also cause the cervical mucus to thicken, preventing the sperm from entering the uterus. Birth control pills are prescribed by a health care provider.Birth control pills can also be used to treat heavy periods.  Minipill This type of birth control pill contains only the progesterone hormone. They are taken every day of each month and must be prescribed by your health care provider.  Birth control patch The patch contains hormones similar to those in birth control pills. It must be changed once a week and is prescribed by a health care provider.  Vaginal ring The ring contains hormones similar to those in birth control pills. It is left in the vagina for 3 weeks,  removed for 1 week, and then a new one is put back in place. The patient must be comfortable inserting and removing the ring from the vagina.A health care provider's prescription is necessary.  Emergency contraception Emergency contraceptives prevent pregnancy after unprotected sexual intercourse. This pill can be taken right after sex or up to 5 days after unprotected sex. It  is most effective the sooner you take the pills after having sexual intercourse. Most emergency contraceptive pills are available without a prescription. Check with your pharmacist. Do not use emergency contraception as your only form of birth control. BARRIER METHODS   Female condom This is a thin sheath (latex or rubber) that is worn over the penis during sexual intercourse. It can be used with spermicide to increase effectiveness.  Female condom. This is a soft, loose-fitting sheath that is put into the vagina before sexual intercourse.  Diaphragm This is a soft, latex, dome-shaped barrier that must be fitted by a health care provider. It is inserted into the vagina, along with a spermicidal jelly. It is inserted before intercourse. The diaphragm should be left in the vagina for 6 to 8 hours after intercourse.  Cervical cap This is a round, soft, latex or plastic cup that fits over the cervix and must be fitted by a health care provider. The cap can be left in place for up to 48 hours after intercourse.  Sponge This is a soft, circular piece of polyurethane foam. The sponge has spermicide in it. It is inserted into the vagina after wetting it and before sexual intercourse.  Spermicides These are chemicals that kill or block sperm from entering the cervix and uterus. They come in the form of creams, jellies, suppositories, foam, or tablets. They do not require a prescription. They are inserted into the vagina with an applicator before having sexual intercourse. The process must be repeated every time you have sexual  intercourse. INTRAUTERINE CONTRACEPTION  Intrauterine device (IUD) This is a T-shaped device that is put in a woman's uterus during a menstrual period to prevent pregnancy. There are 2 types:  Copper IUD This type of IUD is wrapped in copper wire and is placed inside the uterus. Copper makes the uterus and fallopian tubes produce a fluid that kills sperm. It can stay in place for 10 years.  Hormone IUD This type of IUD contains the hormone progestin (synthetic progesterone). The hormone thickens the cervical mucus and prevents sperm from entering the uterus, and it also thins the uterine lining to prevent implantation of a fertilized egg. The hormone can weaken or kill the sperm that get into the uterus. It can stay in place for 3 5 years, depending on which type of IUD is used. PERMANENT METHODS OF CONTRACEPTION  Female tubal ligation This is when the woman's fallopian tubes are surgically sealed, tied, or blocked to prevent the egg from traveling to the uterus.  Hysteroscopic sterilization This involves placing a small coil or insert into each fallopian tube. Your doctor uses a technique called hysteroscopy to do the procedure. The device causes scar tissue to form. This results in permanent blockage of the fallopian tubes, so the sperm cannot fertilize the egg. It takes about 3 months after the procedure for the tubes to become blocked. You must use another form of birth control for these 3 months.  Female sterilization This is when the female has the tubes that carry sperm tied off (vasectomy).This blocks sperm from entering the vagina during sexual intercourse. After the procedure, the man can still ejaculate fluid (semen). NATURAL PLANNING METHODS  Natural family planning This is not having sexual intercourse or using a barrier method (condom, diaphragm, cervical cap) on days the woman could become pregnant.  Calendar method This is keeping track of the length of each menstrual cycle and  identifying when you are fertile.  Ovulation method This is  avoiding sexual intercourse during ovulation.  Symptothermal method This is avoiding sexual intercourse during ovulation, using a thermometer and ovulation symptoms.  Post ovulation method This is timing sexual intercourse after you have ovulated. Regardless of which type or method of contraception you choose, it is important that you use condoms to protect against the transmission of sexually transmitted infections (STIs). Talk with your health care provider about which form of contraception is most appropriate for you. Document Released: 07/03/2005 Document Revised: 03/05/2013 Document Reviewed: 12/26/2012 [facility] Patient Information 2014 [place], [place].

## 2013-11-08 NOTE — Lactation Note (Signed)
This note was copied from the chart of [name] [name]. Lactation Consultation Note Follow up consult:  When asked about baby's feeding mother states she has not been keeping track of feedings. Provided mother with feeding log and a pencil.  Reviewed volume guidelines with mother. Mother is primarily formula feeding.  Encouraged mother to pump if she wants to maintain her milk supply. Mother does not seem interested in assistance at this time.   Patient Name: [name] [name] [name] Date: 11/08/2013 Reason for consult: Follow-up assessment   Maternal Data    Feeding Feeding Type: Bottle Fed - Formula  LATCH Score/Interventions                      Lactation Tools Discussed/Used     Consult Status Consult Status: Follow-up Date: 11/09/13 Follow-up type: In-patient    Dulce SellarRuth [name] [name] [name] 11/08/2013, 11:04 AM

## 2013-11-11 LAB — CULTURE, BLOOD (ROUTINE X 2)
Culture: NO GROWTH
Culture: NO GROWTH

## 2014-04-14 ENCOUNTER — Emergency Department (HOSPITAL_COMMUNITY)
Admission: EM | Admit: 2014-04-14 | Discharge: 2014-04-14 | Disposition: A | Discharge status: Home / Self Care | Payer: Medicaid Other | Attending: Emergency Medicine | Admitting: Emergency Medicine

## 2014-04-14 ENCOUNTER — Encounter (HOSPITAL_COMMUNITY): Payer: Self-pay | Admitting: Emergency Medicine

## 2014-04-14 DIAGNOSIS — R197 Diarrhea, unspecified: Secondary | ICD-10-CM | POA: Insufficient documentation

## 2014-04-14 DIAGNOSIS — F319 Bipolar disorder, unspecified: Secondary | ICD-10-CM | POA: Diagnosis not present

## 2014-04-14 DIAGNOSIS — Z79899 Other long term (current) drug therapy: Secondary | ICD-10-CM | POA: Insufficient documentation

## 2014-04-14 DIAGNOSIS — F411 Generalized anxiety disorder: Secondary | ICD-10-CM | POA: Insufficient documentation

## 2014-04-14 DIAGNOSIS — F209 Schizophrenia, unspecified: Secondary | ICD-10-CM | POA: Insufficient documentation

## 2014-04-14 DIAGNOSIS — Z3202 Encounter for pregnancy test, result negative: Secondary | ICD-10-CM | POA: Diagnosis not present

## 2014-04-14 DIAGNOSIS — K644 Residual hemorrhoidal skin tags: Secondary | ICD-10-CM | POA: Diagnosis not present

## 2014-04-14 DIAGNOSIS — R109 Unspecified abdominal pain: Secondary | ICD-10-CM | POA: Insufficient documentation

## 2014-04-14 DIAGNOSIS — N390 Urinary tract infection, site not specified: Secondary | ICD-10-CM

## 2014-04-14 DIAGNOSIS — Z87891 Personal history of nicotine dependence: Secondary | ICD-10-CM | POA: Diagnosis not present

## 2014-04-14 DIAGNOSIS — I10 Essential (primary) hypertension: Secondary | ICD-10-CM | POA: Insufficient documentation

## 2014-04-14 DIAGNOSIS — Z87442 Personal history of urinary calculi: Secondary | ICD-10-CM | POA: Insufficient documentation

## 2014-04-14 HISTORY — DX: Disorder of kidney and ureter, unspecified: N28.9

## 2014-04-14 HISTORY — DX: Unspecified hemorrhoids: K64.9

## 2014-04-14 LAB — COMPREHENSIVE METABOLIC PANEL
ALBUMIN: 4 g/dL (ref 3.5–5.2)
ALK PHOS: 81 U/L (ref 39–117)
ALT: 19 U/L (ref 0–35)
AST: 12 U/L (ref 0–37)
Anion gap: 14 (ref 5–15)
BUN: 12 mg/dL (ref 6–23)
CHLORIDE: 103 meq/L (ref 96–112)
CO2: 25 mEq/L (ref 19–32)
Calcium: 9.2 mg/dL (ref 8.4–10.5)
Creatinine, Ser: 0.84 mg/dL (ref 0.50–1.10)
GFR calc Af Amer: >90 mL/min (ref >90)
GFR calc non Af Amer: >90 mL/min (ref >90)
Glucose, Bld: 94 mg/dL (ref 70–99)
POTASSIUM: 4 meq/L (ref 3.7–5.3)
SODIUM: 142 meq/L (ref 137–147)
Total Bilirubin: 0.4 mg/dL (ref 0.3–1.2)
Total Protein: 7.5 g/dL (ref 6.0–8.3)

## 2014-04-14 LAB — CBC WITH DIFFERENTIAL/PLATELET
BASOS ABS: 0 10*3/uL (ref 0.0–0.1)
BASOS PCT: 0 % (ref 0–1)
Eosinophils Absolute: 0.1 10*3/uL (ref 0.0–0.7)
Eosinophils Relative: 0 % (ref 0–5)
HCT: 42.8 % (ref 36.0–46.0)
HEMOGLOBIN: 14.6 g/dL (ref 12.0–15.0)
Lymphocytes Relative: 20 % (ref 12–46)
Lymphs Abs: 2.3 10*3/uL (ref 0.7–4.0)
MCH: 31.7 pg (ref 26.0–34.0)
MCHC: 34.1 g/dL (ref 30.0–36.0)
MCV: 92.8 fL (ref 78.0–100.0)
Monocytes Absolute: 0.5 10*3/uL (ref 0.1–1.0)
Monocytes Relative: 4 % (ref 3–12)
NEUTROS ABS: 8.6 10*3/uL — AB (ref 1.7–7.7)
NEUTROS PCT: 76 % (ref 43–77)
Platelets: 274 10*3/uL (ref 150–400)
RBC: 4.61 MIL/uL (ref 3.87–5.11)
RDW: 13 % (ref 11.5–15.5)
WBC: 11.5 10*3/uL — ABNORMAL HIGH (ref 4.0–10.5)

## 2014-04-14 LAB — LIPASE, BLOOD: LIPASE: 15 U/L (ref 11–59)

## 2014-04-14 LAB — ACETAMINOPHEN LEVEL

## 2014-04-14 LAB — URINALYSIS, ROUTINE W REFLEX MICROSCOPIC
BILIRUBIN URINE: NEGATIVE
GLUCOSE, UA: NEGATIVE mg/dL
Hgb urine dipstick: NEGATIVE
Ketones, ur: NEGATIVE mg/dL
Nitrite: NEGATIVE
PH: 6 (ref 5.0–8.0)
Protein, ur: 30 mg/dL — AB
Specific Gravity, Urine: 1.028 (ref 1.005–1.030)
Urobilinogen, UA: 0.2 mg/dL (ref 0.0–1.0)

## 2014-04-14 LAB — POC URINE PREG, ED: Preg Test, Ur: NEGATIVE

## 2014-04-14 LAB — URINE MICROSCOPIC-ADD ON

## 2014-04-14 LAB — POC OCCULT BLOOD, ED: FECAL OCCULT BLD: NEGATIVE

## 2014-04-14 MED ORDER — CEPHALEXIN 500 MG PO CAPS: 500.0000 mg | ORAL_CAPSULE | Freq: Four times a day (QID) | ORAL | Status: DC

## 2014-04-14 MED ORDER — ONDANSETRON HCL 4 MG PO TABS: 4.0000 mg | ORAL_TABLET | Freq: Three times a day (TID) | ORAL | Status: DC | PRN

## 2014-04-14 MED ORDER — TRAMADOL HCL 50 MG PO TABS: 50.0000 mg | ORAL_TABLET | Freq: Four times a day (QID) | ORAL | Status: DC | PRN

## 2014-04-14 MED ORDER — SODIUM CHLORIDE 0.9 % IV SOLN
1000.0000 mL | Freq: Once | INTRAVENOUS | Status: AC
Start: 2014-04-14 — End: 2014-04-14
  Administered 2014-04-14: 1000 mL via INTRAVENOUS

## 2014-04-14 NOTE — ED Notes (Signed)
Pt from home with c/o hemmrhoid bleeding x 5 months, since birth of second child.  Pt also reports kidney stone pain on left side. Hx of recent 9 mm stone removed with small stones that pt was told should pass.  Pt reports scant blood on tissue paper after urination.  These symptoms have been occuring "for a while now," nausea and vomiting started x 4 days.  Pt ambulatory to room with ease, NAD, A&O.

## 2014-04-14 NOTE — ED Provider Notes (Signed)
CSN: [id number]     Arrival date & time 04/14/14  0848 History   First MD Initiated Contact with Patient 04/14/14 [phone]     Chief Complaint  Patient presents with  . Flank Pain  . Hemorrhoids     (Consider location/radiation/quality/duration/timing/severity/associated sxs/prior Treatment) HPI   [name] [name] is a(n) 23 y.o. female who presents to the ED with cc of rectal bleeding and hematuria. She is s/p SVD of healthy female baby of 7 mos. She c/o mild bleeding internal hemorrhoids since birth of her son. She states that she finds scant blood after wiping her bottom frequently. She denies rectal pain or pain with defecation. She has been using otc hemorrhoid, however they have not seemed to resolve. She has IBS with chronic diarrhea.She is also c/o of noticing some blood in her urine when she wipes. She denies other urinary sxs. She has a hx of Left sided 9 mm stone that required a lithotripsy. She states that she had hematuria last time, however she also had severe pain and she had no pain at this time She also had some nausea and vomiting the 2 days ago. She states that she was afraid she might be getting another stone. She denies any vaginal sxs. Denies fevers, chills, myalgias, arthralgias. Denies DOE, SOB, chest tightness or pressure, radiation to left arm, jaw or back, or diaphoresis. Denies dysuria, flank pain, suprapubic pain, frequency, urgency. Denies headaches, light headedness, weakness, visual disturbances. Denies abdominal pain, or constipation.     Past Medical History  Diagnosis Date  . History of hypertension no meds  . Left ureteral calculus     lithrotripsy  . Frequency of urination   . Urgency of urination   . Nocturia   . ADHD (attention deficit hyperactivity disorder)   . Depression   . Bipolar 1 disorder   . Anxiety   . Schizophrenia     MILD  . HTN in pregnancy, chronic     HX OF HTN; NO MEDS  . Renal disorder   . Hemorrhoid    Past Surgical History   Procedure Laterality Date  . Wisdom tooth extraction  2009- oral md office  . Cystoscopy/retrograde/ureteroscopy  08/25/2011    Procedure: CYSTOSCOPY/RETROGRADE/URETEROSCOPY;  Surgeon: Antony HasteMatthew Ramsey Eskridge, MD;  [facility] [place];  Service: Urology;  Laterality: Left;  cystoscopy LEFT retrograde pyelogram LEFT URETEROSCOPY holmium  LASER LITHO AND BASKET EXTRACTION C-ARM LASER  . Kidney stone surgery  07/2011    9 MM  . Dilation and evacuation  12/29/2011    Procedure: DILATATION AND EVACUATION;  Surgeon: Purcell NailsAngela Y Roberts, MD;  Location: [facility];  Service: Gynecology;  Laterality: N/A;   Family History  Problem Relation Age of Onset  . Heart disease Paternal Grandfather   . Heart disease Maternal Grandmother   . Hypertension Maternal Grandmother   . Other Mother     VARICOSE VEINS  . Anxiety disorder Mother   . Diabetes Maternal Grandfather    History  Substance Use Topics  . Smoking status: Former Smoker -- 0.25 packs/day    Types: Cigarettes    Quit date: 11/12/2011  . Smokeless tobacco: Never Used  . Alcohol Use: No     Comment: RARELY DURING SOCIAL OCCASIONS   OB History   Grav Para Term Preterm Abortions TAB SAB Ect Mult Living   3 2  2 1  1   2      Review of Systems  Ten systems reviewed and are negative for  acute change, except as noted in the HPI.    Allergies  Review of patient's allergies indicates no known allergies.  Home Medications   Prior to Admission medications   Medication Sig Start Date End Date Taking? Authorizing Provider  acetaminophen (TYLENOL) 500 MG tablet Take 1,000-1,500 mg by mouth every 4 (four) hours as needed (for tooth pain).    Yes Historical Provider, MD  clonazePAM (KLONOPIN) 1 MG tablet Take 1 mg by mouth 3 (three) times daily as needed for anxiety.   Yes Historical Provider, MD  lamoTRIgine (LAMICTAL) 25 MG tablet Take 100 mg by mouth daily.    Yes Historical Provider, MD  Loperamide HCl (ANTI-DIARRHEAL PO) Take  1 tablet by mouth daily as needed (diarrhea).   Yes Historical Provider, MD  phenylephrine-shark liver oil-mineral oil-petrolatum (PREPARATION H) 0.25-3-14-71.9 % rectal ointment Place 1 application rectally 4 (four) times daily as needed for hemorrhoids.   Yes Historical Provider, MD  ranitidine (ZANTAC) 150 MG tablet Take 150 mg by mouth daily.   Yes Historical Provider, MD   BP 98/67  Pulse 75  Temp(Src) 98.3 F (36.8 C) (Oral)  Resp 17  SpO2 99%  LMP 03/08/2014 Physical Exam  Constitutional: She is oriented to person, place, and time. She appears well-developed and well-nourished. No distress.  HENT:  Head: Normocephalic and atraumatic.  Eyes: Conjunctivae are normal. No scleral icterus.  Neck: Normal range of motion.  Cardiovascular: Normal rate, regular rhythm and normal heart sounds.  Exam reveals no gallop and no friction rub.   No murmur heard. Pulmonary/Chest: Effort normal and breath sounds normal. No respiratory distress.  Abdominal: Soft. Bowel sounds are normal. She exhibits no distension and no mass. There is no tenderness. There is no guarding.  No cva tenderness, no suprapubic tenderness  Genitourinary:  Digital Rectal Exam reveals sphincter with good tone. Non-thrombosed external hemorrhoids. No masses or fissures. Stool color is brown with no overt blood.   Musculoskeletal: Normal range of motion.  Neurological: She is alert and oriented to person, place, and time.  Skin: Skin is warm and dry. She is not diaphoretic.    ED Course  Procedures (including critical care time) Labs Review Labs Reviewed  CBC WITH DIFFERENTIAL - Abnormal; Notable for the following:    WBC 11.5 (*)    Neutro Abs 8.6 (*)    All other components within normal limits  URINALYSIS, ROUTINE W REFLEX MICROSCOPIC - Abnormal; Notable for the following:    APPearance HAZY (*)    Protein, ur 30 (*)    Leukocytes, UA TRACE (*)    All other components within normal limits  URINE  MICROSCOPIC-ADD ON - Abnormal; Notable for the following:    Squamous Epithelial / LPF FEW (*)    Bacteria, UA FEW (*)    All other components within normal limits  URINE CULTURE  COMPREHENSIVE METABOLIC PANEL  LIPASE, BLOOD  ACETAMINOPHEN LEVEL  POC URINE PREG, ED  POC OCCULT BLOOD, ED    Imaging Review No results found.   EKG Interpretation None      MDM   Final diagnoses:  UTI (lower urinary tract infection)    Patient with negative FOBT.  ? UTI as source of hematuria ,  Elevated WBC isn urine. Few bacteria.  Sent for cuture. Highly doubt stone as source of hematuria. Will treat fot UTI. F/u with pcp. Discussed return precautions.     Arthor Captain, PA-C 04/18/14 [phone]

## 2014-04-14 NOTE — ED Notes (Signed)
Pt ambulated to restroom without difficulty

## 2014-04-14 NOTE — Discharge Instructions (Signed)

## 2014-04-15 LAB — URINE CULTURE

## 2014-04-20 NOTE — ED Provider Notes (Signed)
Medical screening examination/treatment/procedure(s) were performed by non-physician practitioner and as supervising physician I was immediately available for consultation/collaboration.   EKG Interpretation None       Assunta Pupo R. Arabell Neria, MD 04/20/14 0711 

## 2014-05-18 ENCOUNTER — Encounter (HOSPITAL_COMMUNITY): Payer: Self-pay | Admitting: Emergency Medicine

## 2014-06-08 ENCOUNTER — Emergency Department (INDEPENDENT_AMBULATORY_CARE_PROVIDER_SITE_OTHER)
Admission: EM | Admit: 2014-06-08 | Discharge: 2014-06-08 | Disposition: A | Discharge status: Home / Self Care | Payer: Medicaid Other | Source: Home / Self Care | Attending: Emergency Medicine | Admitting: Emergency Medicine

## 2014-06-08 ENCOUNTER — Encounter (HOSPITAL_COMMUNITY): Payer: Self-pay | Admitting: Emergency Medicine

## 2014-06-08 DIAGNOSIS — K529 Noninfective gastroenteritis and colitis, unspecified: Secondary | ICD-10-CM

## 2014-06-08 MED ORDER — METRONIDAZOLE 500 MG PO TABS: 500.0000 mg | ORAL_TABLET | Freq: Two times a day (BID) | ORAL | Status: DC

## 2014-06-08 MED ORDER — ONDANSETRON 4 MG PO TBDP
ORAL_TABLET | ORAL | Status: AC
  Filled 2014-06-08: qty 2

## 2014-06-08 MED ORDER — CIPROFLOXACIN HCL 500 MG PO TABS: 500.0000 mg | ORAL_TABLET | Freq: Two times a day (BID) | ORAL | Status: DC

## 2014-06-08 MED ORDER — ONDANSETRON 8 MG PO TBDP: 8.0000 mg | ORAL_TABLET | Freq: Three times a day (TID) | ORAL | Status: DC | PRN

## 2014-06-08 MED ORDER — ONDANSETRON 4 MG PO TBDP
8.0000 mg | ORAL_TABLET | Freq: Once | ORAL | Status: AC
  Administered 2014-06-08: 8 mg via ORAL

## 2014-06-08 NOTE — ED Notes (Signed)
Reports 2 week history of nausea, vomiting, diarrhea.  Reports she will have symptoms 3-4 days.  Then have a day that she feels fine and within 24 hours is having the same symptoms.  Reports vomiting x 1 , diarrhea x 3 today.

## 2014-06-08 NOTE — Discharge Instructions (Signed)

## 2014-06-08 NOTE — ED Provider Notes (Signed)
Chief Complaint   Diarrhea and Emesis    History of Present Illness   [name] [name] is a 23 year old female who's had a two-week history of nausea, vomiting, and diarrhea. At this point her stools are just green water. She denies any blood in the stool. There's been no blood in the vomitus. She's had subjective fever and chills and left lower quadrant crampy pain. She denies any suspicious ingestions or foreign travel. She has felt somewhat dizzy and lightheaded. She denies any GI or GU complaints.  Review of Systems   Other than as noted above, the patient denies any of the following symptoms: Systemic:  No fevers, chills, or dizziness. GI:  Blood in stool or vomitus. GU:  No dysuria, frequency, or urgency.  PMFSH   Past medical history, family history, social history, meds, and allergies were reviewed.  She takes Depo-Provera, Lamictal, Klonopin, and Neurontin.  Physical Exam     Vital signs:  Temp(Src) 99 F (37.2 C) (Oral)  SpO2 97% Orthostatic VS for the past 24 hrs:  BP- Lying Pulse- Lying BP- Sitting Pulse- Sitting BP- Standing at 0 minutes Pulse- Standing at 0 minutes  06/08/14 0922 115/67 mmHg 73 118/78 mmHg 80 128/73 mmHg 100   General:  Alert and oriented.  In no distress.  Skin warm and dry.  Good skin turgor, brisk capillary refill. ENT:  No scleral icterus, moist mucous membranes, no oral lesions, pharynx clear. Lungs:  Breath sounds clear and equal bilaterally.  No wheezes, rales, or rhonchi. Heart:  Rhythm regular, without extrasystoles.  No gallops or murmers. Abdomen:  Abdomen is soft, flat, nondistended. Bowel sounds are normally active. No organomegaly or mass. She has mild, generalized tenderness to palpation without guarding or rebound. Skin: Clear, warm, and dry.  Good turgor.  Brisk capillary refill.   Course in Urgent Care Center   The following medications were given:  Medications  ondansetron (ZOFRAN-ODT) disintegrating tablet 8 mg (8 mg Oral  Given 06/08/14 0945)   Assessment   The encounter diagnosis was Gastroenteritis.  Gastroenteritis symptoms going on for 2 weeks implies bacterial origin. Stool cultures are being obtained. The patient was encouraged to get clear liquids such as Gatorade, juices, or ginger ale. Return here if she is unable to keep liquids on her stomach or vomiting persists. Also return here if she has any blood in her stools. We'll call her back about results of the stool cultures. If cultures are negative and symptoms persist, she'll need to see a gastroenterologist.  Plan   1.  Meds:  The following meds were prescribed:  Discharge Medication List as of 06/08/2014  9:33 AM    START taking these medications   Details  ciprofloxacin (CIPRO) 500 MG tablet Take 1 tablet (500 mg total) by mouth every 12 (twelve) hours., Starting 06/08/2014, Until Discontinued, Normal    metroNIDAZOLE (FLAGYL) 500 MG tablet Take 1 tablet (500 mg total) by mouth 2 (two) times daily., Starting 06/08/2014, Until Discontinued, Normal    ondansetron (ZOFRAN ODT) 8 MG disintegrating tablet Take 1 tablet (8 mg total) by mouth every 8 (eight) hours as needed for nausea., Starting 06/08/2014, Until Discontinued, Normal        2.  Patient Education/Counseling:  The patient was given appropriate handouts, self care instructions, and instructed in symptomatic relief. The patient was told to stay on clear liquids for the remainder of the day, then advance to a B.R.A.T. diet starting tomorrow.   3.  Follow up:  The patient  was told to follow up here if no better in 2 to 3 days, or sooner if becoming worse in any way, and given some red flag symptoms such as persistent vomitng, high fever, severe abdominal pain, or any GI bleeding which would prompt immediate return.        Reuben Likesavid C Ryer Asato, MD 06/08/14 [phone]

## 2014-06-08 NOTE — ED Notes (Signed)
Patient unable to give stool specimen now.  Patient given supplies and instructions to collect specimen at home

## 2015-04-04 ENCOUNTER — Inpatient Hospital Stay (HOSPITAL_COMMUNITY)
Admission: AD | Admit: 2015-04-04 | Discharge: 2015-04-04 | Disposition: A | Discharge status: Home / Self Care | Payer: Medicaid Other | Source: Ambulatory Visit | Attending: Family Medicine | Admitting: Family Medicine

## 2015-04-04 ENCOUNTER — Encounter (HOSPITAL_COMMUNITY): Payer: Self-pay | Admitting: *Deleted

## 2015-04-04 DIAGNOSIS — B373 Candidiasis of vulva and vagina: Secondary | ICD-10-CM

## 2015-04-04 DIAGNOSIS — B3731 Acute candidiasis of vulva and vagina: Secondary | ICD-10-CM

## 2015-04-04 DIAGNOSIS — N898 Other specified noninflammatory disorders of vagina: Secondary | ICD-10-CM | POA: Diagnosis present

## 2015-04-04 HISTORY — DX: Herpesviral infection, unspecified: B00.9

## 2015-04-04 HISTORY — DX: Irritable bowel syndrome, unspecified: K58.9

## 2015-04-04 HISTORY — DX: Calculus of kidney: N20.0

## 2015-04-04 LAB — URINALYSIS, ROUTINE W REFLEX MICROSCOPIC
Bilirubin Urine: NEGATIVE
GLUCOSE, UA: NEGATIVE mg/dL
HGB URINE DIPSTICK: NEGATIVE
Ketones, ur: NEGATIVE mg/dL
Leukocytes, UA: NEGATIVE
Nitrite: NEGATIVE
PROTEIN: NEGATIVE mg/dL
Specific Gravity, Urine: 1.015 (ref 1.005–1.030)
Urobilinogen, UA: 0.2 mg/dL (ref 0.0–1.0)
pH: 6 (ref 5.0–8.0)

## 2015-04-04 LAB — WET PREP, GENITAL
Clue Cells Wet Prep HPF POC: NONE SEEN
Trich, Wet Prep: NONE SEEN
Yeast Wet Prep HPF POC: NONE SEEN

## 2015-04-04 LAB — POCT PREGNANCY, URINE: Preg Test, Ur: NEGATIVE

## 2015-04-04 MED ORDER — TRIAMCINOLONE ACETONIDE 0.1 % EX CREA: 1.0000 "application " | TOPICAL_CREAM | Freq: Two times a day (BID) | CUTANEOUS | Status: DC

## 2015-04-04 MED ORDER — NYSTATIN 100000 UNIT/GM EX CREA: 1.0000 "application " | TOPICAL_CREAM | Freq: Two times a day (BID) | CUTANEOUS | Status: DC

## 2015-04-04 NOTE — Discharge Instructions (Signed)

## 2015-04-04 NOTE — MAU Provider Note (Signed)
History     CSN: [id number]  Arrival date and time: 04/04/15 1203   First Provider Initiated Contact with Patient 04/04/15 1238      Chief Complaint  Patient presents with  . Vaginitis   HPI 24 y.o. [id number] with vaginal irritation x a few days. Pt denies discharge, itching, odor, lesions. 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   Past Medical History  Diagnosis Date  . History of hypertension no meds  . Left ureteral calculus     lithrotripsy  . Frequency of urination   . Urgency of urination   . Nocturia   . ADHD (attention deficit hyperactivity disorder)   . Depression   . Bipolar 1 disorder   . Anxiety   . Schizophrenia     MILD  . HTN in pregnancy, chronic     HX OF HTN; NO MEDS  . Renal disorder   . Hemorrhoid   . Kidney stone   . Herpes   . IBS (irritable bowel syndrome)   . Schizophrenia   . Bipolar 1 disorder   . ADHD (attention deficit hyperactivity disorder)     Past Surgical History  Procedure Laterality Date  . Wisdom tooth extraction  2009- oral md office  . Cystoscopy/retrograde/ureteroscopy  08/25/2011    Procedure: CYSTOSCOPY/RETROGRADE/URETEROSCOPY;  Surgeon: Antony Haste, MD;  Location: [facility];  Service: Urology;  Laterality: Left;  cystoscopy LEFT retrograde pyelogram LEFT URETEROSCOPY holmium  LASER LITHO AND BASKET EXTRACTION C-ARM LASER  . Kidney stone surgery  07/2011    9 MM  . Dilation and evacuation  12/29/2011    Procedure: DILATATION AND EVACUATION;  Surgeon: Purcell Nails, MD;  Location: [facility];  Service: Gynecology;  Laterality: N/A;    Family History  Problem Relation Age of Onset  . Heart disease Paternal Grandfather   . Heart disease Maternal Grandmother   . Hypertension Maternal Grandmother   . Other Mother     VARICOSE VEINS  . Anxiety disorder Mother   . Diabetes Maternal Grandfather     Social History  Substance Use Topics  . Smoking status: Current Every Day  Smoker -- 0.25 packs/day    Types: Cigarettes    Last Attempt to Quit: 11/12/2011  . Smokeless tobacco: Never Used  . Alcohol Use: No     Comment: RARELY DURING SOCIAL OCCASIONS    Allergies: No Known Allergies  No prescriptions prior to admission    Review of Systems  Constitutional: Negative.   Respiratory: Negative.   Cardiovascular: Negative.   Gastrointestinal: Negative for nausea, vomiting, abdominal pain, diarrhea and constipation.  Genitourinary: Negative for dysuria, urgency, frequency, hematuria and flank pain.       Negative for vaginal bleeding, vaginal discharge, dyspareunia  Musculoskeletal: Negative.   Neurological: Negative.   Psychiatric/Behavioral: Negative.    Physical Exam   Blood pressure 133/71, pulse 75, temperature 98.7 F (37.1 C), temperature source Oral, resp. rate 16, height  (1.702 m), weight 230 lb (104.327 kg), unknown if currently breastfeeding.  Physical Exam  Nursing note and vitals reviewed. Constitutional: She is oriented to person, place, and time. She appears well-developed and well-nourished. No distress.  Cardiovascular: Normal rate.   Respiratory: Effort normal.  Genitourinary:    Cervix exhibits no discharge. No erythema or bleeding in the vagina. No vaginal discharge found.  Inflammation and shiny skin over vulva, fissure x 2  Musculoskeletal: Normal range of motion.  Neurological: She is  alert and oriented to person, place, and time.  Skin: Skin is warm and dry.  Psychiatric: She has a normal mood and affect.    MAU Course  Procedures  Results for orders placed or performed during the hospital encounter of 04/04/15 (from the past 24 hour(s))  Urinalysis, Routine w reflex microscopic (not at [facility])     Status: None   Collection Time: 04/04/15 12:10 PM  Result Value Ref Range   Color, Urine YELLOW YELLOW   APPearance CLEAR CLEAR   Specific Gravity, Urine 1.015 1.005 - 1.030   pH 6.0 5.0 - 8.0   Glucose, UA NEGATIVE  NEGATIVE mg/dL   Hgb urine dipstick NEGATIVE NEGATIVE   Bilirubin Urine NEGATIVE NEGATIVE   Ketones, ur NEGATIVE NEGATIVE mg/dL   Protein, ur NEGATIVE NEGATIVE mg/dL   Urobilinogen, UA 0.2 0.0 - 1.0 mg/dL   Nitrite NEGATIVE NEGATIVE   Leukocytes, UA NEGATIVE NEGATIVE  Pregnancy, urine POC     Status: None   Collection Time: 04/04/15 12:17 PM  Result Value Ref Range   Preg Test, Ur NEGATIVE NEGATIVE  Wet prep, genital     Status: Abnormal   Collection Time: 04/04/15 12:40 PM  Result Value Ref Range   Yeast Wet Prep HPF POC NONE SEEN NONE SEEN   Trich, Wet Prep NONE SEEN NONE SEEN   Clue Cells Wet Prep HPF POC NONE SEEN NONE SEEN   WBC, Wet Prep HPF POC FEW (A) NONE SEEN     Assessment and Plan   1. Vulvovaginal candidiasis   Exam c/w fungal infection, rx triamcinolone and nystatin creams, HSV culture collected considering h/o + serum test and fissures, discussed at length w/ patient, will treat if + culture, exam not highly suspicious for HSV outbreak    Medication List    STOP taking these medications        cephALEXin 500 MG capsule  Commonly known as:  KEFLEX     ciprofloxacin 500 MG tablet  Commonly known as:  CIPRO     metroNIDAZOLE 500 MG tablet  Commonly known as:  FLAGYL     ondansetron 4 MG tablet  Commonly known as:  ZOFRAN     ondansetron 8 MG disintegrating tablet  Commonly known as:  ZOFRAN ODT     traMADol 50 MG tablet  Commonly known as:  ULTRAM      TAKE these medications        nystatin cream  Commonly known as:  MYCOSTATIN  Apply 1 application topically 2 (two) times daily.     triamcinolone cream 0.1 %  Commonly known as:  KENALOG  Apply 1 application topically 2 (two) times daily.            Follow-up Information    Follow up with [facility] [place].   Why:  As needed   Contact information:   [address] [place] [address] [phone]        Jahsiah Carpenter 04/04/2015, 2:06 PM

## 2015-04-04 NOTE — MAU Note (Signed)
C/o ? Yeast infection for past 4-6 days; on Depo injections for [facility];

## 2015-04-05 LAB — HIV ANTIBODY (ROUTINE TESTING W REFLEX): HIV Screen 4th Generation wRfx: NONREACTIVE

## 2015-04-05 LAB — GC/CHLAMYDIA PROBE AMP (~~LOC~~) NOT AT ARMC
Chlamydia: NEGATIVE
NEISSERIA GONORRHEA: NEGATIVE

## 2015-04-07 LAB — HERPES SIMPLEX VIRUS CULTURE: Culture: NOT DETECTED

## 2015-06-04 ENCOUNTER — Encounter (HOSPITAL_COMMUNITY): Payer: Self-pay | Admitting: *Deleted

## 2015-06-04 ENCOUNTER — Inpatient Hospital Stay (HOSPITAL_COMMUNITY)
Admission: AD | Admit: 2015-06-04 | Discharge: 2015-06-04 | Disposition: A | Discharge status: Home / Self Care | Payer: Medicaid Other | Source: Ambulatory Visit | Attending: Obstetrics and Gynecology | Admitting: Obstetrics and Gynecology

## 2015-06-04 DIAGNOSIS — Z113 Encounter for screening for infections with a predominantly sexual mode of transmission: Secondary | ICD-10-CM | POA: Insufficient documentation

## 2015-06-04 DIAGNOSIS — N898 Other specified noninflammatory disorders of vagina: Secondary | ICD-10-CM | POA: Diagnosis not present

## 2015-06-04 LAB — URINALYSIS, ROUTINE W REFLEX MICROSCOPIC
BILIRUBIN URINE: NEGATIVE
Glucose, UA: NEGATIVE mg/dL
Hgb urine dipstick: NEGATIVE
Ketones, ur: NEGATIVE mg/dL
NITRITE: NEGATIVE
Protein, ur: NEGATIVE mg/dL
Specific Gravity, Urine: <1.005 — ABNORMAL LOW (ref 1.005–1.030)
pH: 7 (ref 5.0–8.0)

## 2015-06-04 LAB — URINE MICROSCOPIC-ADD ON: RBC / HPF: NONE SEEN RBC/hpf (ref 0–5)

## 2015-06-04 LAB — POCT PREGNANCY, URINE: PREG TEST UR: NEGATIVE

## 2015-06-04 LAB — WET PREP, GENITAL
Sperm: NONE SEEN
Trich, Wet Prep: NONE SEEN
YEAST WET PREP: NONE SEEN

## 2015-06-04 NOTE — Discharge Instructions (Signed)
Sexually Transmitted Disease °A sexually transmitted disease (STD) is a disease or infection that may be passed (transmitted) from person to person, usually during sexual activity. This may happen by way of saliva, semen, blood, vaginal mucus, or urine. Common STDs include: °· Gonorrhea. °· Chlamydia. °· Syphilis. °· HIV and AIDS. °· Genital herpes. °· Hepatitis B and C. °· Trichomonas. °· Human papillomavirus (HPV). °· Pubic lice. °· Scabies. °· Mites. °· Bacterial vaginosis. °WHAT ARE CAUSES OF STDs? °An STD may be caused by bacteria, a virus, or parasites. STDs are often transmitted during sexual activity if one person is infected. However, they may also be transmitted through nonsexual means. STDs may be transmitted after:  °· Sexual intercourse with an infected person. °· Sharing sex toys with an infected person. °· Sharing needles with an infected person or using unclean piercing or tattoo needles. °· Having intimate contact with the genitals, mouth, or rectal areas of an infected person. °· Exposure to infected fluids during birth. °WHAT ARE THE SIGNS AND SYMPTOMS OF STDs? °Different STDs have different symptoms. Some people may not have any symptoms. If symptoms are present, they may include: °· Painful or bloody urination. °· Pain in the pelvis, abdomen, vagina, anus, throat, or eyes. °· A skin rash, itching, or irritation. °· Growths, ulcerations, blisters, or sores in the genital and anal areas. °· Abnormal vaginal discharge with or without bad odor. °· Penile discharge in men. °· Fever. °· Pain or bleeding during sexual intercourse. °· Swollen glands in the groin area. °· Yellow skin and eyes (jaundice). This is seen with hepatitis. °· Swollen testicles. °· Infertility. °· Sores and blisters in the mouth. °HOW ARE STDs DIAGNOSED? °To make a diagnosis, your health care provider may: °· Take a medical history. °· Perform a physical exam. °· Take a sample of any discharge to examine. °· Swab the throat,  cervix, opening to the penis, rectum, or vagina for testing. °· Test a sample of your first morning urine. °· Perform blood tests. °· Perform a Pap test, if this applies. °· Perform a colposcopy. °· Perform a laparoscopy. °HOW ARE STDs TREATED? °Treatment depends on the STD. Some STDs may be treated but not cured. °· Chlamydia, gonorrhea, trichomonas, and syphilis can be cured with antibiotic medicine. °· Genital herpes, hepatitis, and HIV can be treated, but not cured, with prescribed medicines. The medicines lessen symptoms. °· Genital warts from HPV can be treated with medicine or by freezing, burning (electrocautery), or surgery. Warts may come back. °· HPV cannot be cured with medicine or surgery. However, abnormal areas may be removed from the cervix, vagina, or vulva. °· If your diagnosis is confirmed, your recent sexual partners need treatment. This is true even if they are symptom-free or have a negative culture or evaluation. They should not have sex until their health care providers say it is okay. °· Your health care provider may test you for infection again 3 months after treatment. °HOW CAN I REDUCE MY RISK OF GETTING AN STD? °Take these steps to reduce your risk of getting an STD: °· Use latex condoms, dental dams, and water-soluble lubricants during sexual activity. Do not use petroleum jelly or oils. °· Avoid having multiple sex partners. °· Do not have sex with someone who has other sex partners °· Do not have sex with anyone you do not know or who is at high risk for an STD. °· Avoid risky sex practices that can break your skin. °· Do not have sex   if you have open sores on your mouth or skin. °· Avoid drinking too much alcohol or taking illegal drugs. Alcohol and drugs can affect your judgment and put you in a vulnerable position. °· Avoid engaging in oral and anal sex acts. °· Get vaccinated for HPV and hepatitis. If you have not received these vaccines in the past, talk to your health care  provider about whether one or both might be right for you. °· If you are at risk of being infected with HIV, it is recommended that you take a prescription medicine daily to prevent HIV infection. This is called pre-exposure prophylaxis (PrEP). You are considered at risk if: °¨ You are a man who has sex with other men (MSM). °¨ You are a heterosexual man or woman and are sexually active with more than one partner. °¨ You take drugs by injection. °¨ You are sexually active with a partner who has HIV. °· Talk with your health care provider about whether you are at high risk of being infected with HIV. If you choose to begin PrEP, you should first be tested for HIV. You should then be tested every 3 months for as long as you are taking PrEP. °WHAT SHOULD I DO IF I THINK I HAVE AN STD? °· See your health care provider. °· Tell your sexual partner(s). They should be tested and treated for any STDs. °· Do not have sex until your health care provider says it is okay. °WHEN SHOULD I GET IMMEDIATE MEDICAL CARE? °Contact your health care provider right away if:  °· You have severe abdominal pain. °· You are a man and notice swelling or pain in your testicles. °· You are a woman and notice swelling or pain in your vagina. °  °This information is not intended to replace advice given to you by your health care provider. Make sure you discuss any questions you have with your health care provider. °  °Document Released: 09/23/2002 Document Revised: 07/24/2014 Document Reviewed: 01/21/2013 °Elsevier Interactive Patient Education ©2016 [employer] ° °

## 2015-06-04 NOTE — MAU Provider Note (Signed)
History     CSN: [id number]  Arrival date and time: 06/04/15 1030   First Provider Initiated Contact with Patient 06/04/15 1136      Chief Complaint  Patient presents with  . Vaginal Discharge   HPI [name] [name] 24 y.o. [id number] nonpregnant female presents for eval of vaginal discharge.  She has had slight clear thin discharge x 4 days.  She denies itching, burning, abdominal pain, dysuria, fever, chills.   She had unprotected IC with ex-boyfriend 10 days ago.  She recently had yeast infection and multiple STDs in past but uncertain which ones.   Depo for contraception - UTD. OB History    Gravida Para Term Preterm AB TAB SAB Ectopic Multiple Living   3 2  2 1  1   2       Past Medical History  Diagnosis Date  . History of hypertension no meds  . Left ureteral calculus     lithrotripsy  . Frequency of urination   . Urgency of urination   . Nocturia   . ADHD (attention deficit hyperactivity disorder)   . Depression   . Bipolar 1 disorder (HCC)   . Anxiety   . Schizophrenia (HCC)     MILD  . HTN in pregnancy, chronic     HX OF HTN; NO MEDS  . Renal disorder   . Hemorrhoid   . Kidney stone   . Herpes   . IBS (irritable bowel syndrome)   . Schizophrenia (HCC)   . Bipolar 1 disorder (HCC)   . ADHD (attention deficit hyperactivity disorder)     Past Surgical History  Procedure Laterality Date  . Wisdom tooth extraction  2009- oral md office  . Cystoscopy/retrograde/ureteroscopy  08/25/2011    Procedure: CYSTOSCOPY/RETROGRADE/URETEROSCOPY;  Surgeon: Antony HasteMatthew Ramsey Eskridge, MD;  [facility];  Service: Urology;  Laterality: Left;  cystoscopy LEFT retrograde pyelogram LEFT URETEROSCOPY holmium  LASER LITHO AND BASKET EXTRACTION C-ARM LASER  . Kidney stone surgery  07/2011    9 MM  . Dilation and evacuation  12/29/2011    Procedure: DILATATION AND EVACUATION;  Surgeon: Purcell NailsAngela Y Roberts, MD;  Location: [facility];  Service: Gynecology;  Laterality:  N/A;    Family History  Problem Relation Age of Onset  . Heart disease Paternal Grandfather   . Heart disease Maternal Grandmother   . Hypertension Maternal Grandmother   . Other Mother     VARICOSE VEINS  . Anxiety disorder Mother   . Diabetes Maternal Grandfather     Social History  Substance Use Topics  . Smoking status: Current Every Day Smoker -- 0.25 packs/day    Types: Cigarettes    Last Attempt to Quit: 11/12/2011  . Smokeless tobacco: Never Used  . Alcohol Use: Yes     Comment: RARELY DURING SOCIAL OCCASIONS    Allergies: No Known Allergies  Prescriptions prior to admission  Medication Sig Dispense Refill Last Dose  . clonazePAM (KLONOPIN) 0.5 MG tablet Take 1 tablet by mouth daily as needed for anxiety.   1 Past Week at Unknown time  . medroxyPROGESTERone (DEPO-PROVERA) 150 MG/ML injection Inject 150 mg into the muscle every 3 (three) months.   05/26/2015  . ziprasidone (GEODON) 40 MG capsule Take 40 mg by mouth at bedtime.  1 Past Week at Unknown time  . nystatin cream (MYCOSTATIN) Apply 1 application topically 2 (two) times daily. (Patient not taking: Reported on 06/04/2015) 30 g 0 Not Taking at Unknown time  . triamcinolone  cream (KENALOG) 0.1 % Apply 1 application topically 2 (two) times daily. (Patient not taking: Reported on 06/04/2015) 30 g 0 Not Taking at Unknown time    ROS Pertinent ROS in HPI.  All other systems are negative.   Physical Exam   Blood pressure 123/77, pulse 107, temperature 98.7 F (37.1 C), temperature source Oral, resp. rate 18, last menstrual period 05/23/2015, unknown if currently breastfeeding.  Physical Exam  Constitutional: She is oriented to person, place, and time. She appears well-developed and well-nourished. No distress.  HENT:  Head: Normocephalic and atraumatic.  Eyes: Conjunctivae and EOM are normal.  Neck: Normal range of motion. Neck supple.  Cardiovascular: Normal rate, regular rhythm and normal heart sounds.    Respiratory: Effort normal and breath sounds normal. No respiratory distress.  GI: Soft. Bowel sounds are normal. She exhibits no distension. There is no tenderness. There is no rebound and no guarding.  Genitourinary:  Normal physiologic discharge, No CMT, No adnexal mass or tenderness  Musculoskeletal: Normal range of motion.  Neurological: She is alert and oriented to person, place, and time.  Skin: Skin is warm and dry.  Psychiatric: She has a normal mood and affect. Her behavior is normal.   Results for orders placed or performed during the hospital encounter of 06/04/15 (from the past 24 hour(s))  Urinalysis, Routine w reflex microscopic (not at [facility])     Status: Abnormal   Collection Time: 06/04/15 10:52 AM  Result Value Ref Range   Color, Urine YELLOW YELLOW   APPearance CLEAR CLEAR   Specific Gravity, Urine <1.005 (L) 1.005 - 1.030   pH 7.0 5.0 - 8.0   Glucose, UA NEGATIVE NEGATIVE mg/dL   Hgb urine dipstick NEGATIVE NEGATIVE   Bilirubin Urine NEGATIVE NEGATIVE   Ketones, ur NEGATIVE NEGATIVE mg/dL   Protein, ur NEGATIVE NEGATIVE mg/dL   Nitrite NEGATIVE NEGATIVE   Leukocytes, UA SMALL (A) NEGATIVE  Urine microscopic-add on     Status: Abnormal   Collection Time: 06/04/15 10:52 AM  Result Value Ref Range   Squamous Epithelial / LPF 0-5 (A) NONE SEEN   WBC, UA 0-5 0 - 5 WBC/hpf   RBC / HPF NONE SEEN 0 - 5 RBC/hpf   Bacteria, UA RARE (A) NONE SEEN  Pregnancy, urine POC     Status: None   Collection Time: 06/04/15 11:03 AM  Result Value Ref Range   Preg Test, Ur NEGATIVE NEGATIVE  Wet prep, genital     Status: Abnormal   Collection Time: 06/04/15 11:45 AM  Result Value Ref Range   Yeast Wet Prep HPF POC NONE SEEN NONE SEEN   Trich, Wet Prep NONE SEEN NONE SEEN   Clue Cells Wet Prep HPF POC PRESENT (A) NONE SEEN   WBC, Wet Prep HPF POC MANY (A) NONE SEEN   Sperm NONE SEEN     MAU Course  Procedures  MDM No definitive infection per labs  Assessment and Plan   A: vaginal discharge STD screening  P: Discharge to home STD labs pending Condoms for all IC F/u HD for further STD checks Patient may return to MAU as needed or if her condition were to change or worsen   Bertram Denver 06/04/2015, 12:03 PM

## 2015-06-04 NOTE — MAU Note (Signed)
Thinks she might have a bacterial infection.  Just doesn't feel right down there. No pain, itching or irritation.  Reg d/c, no odor.  Just wants to be tested

## 2015-06-05 LAB — RPR: RPR Ser Ql: NONREACTIVE

## 2015-06-05 LAB — HIV ANTIBODY (ROUTINE TESTING W REFLEX): HIV Screen 4th Generation wRfx: NONREACTIVE

## 2015-06-07 LAB — GC/CHLAMYDIA PROBE AMP (~~LOC~~) NOT AT ARMC
Chlamydia: NEGATIVE
NEISSERIA GONORRHEA: NEGATIVE

## 2015-08-06 ENCOUNTER — Encounter (HOSPITAL_COMMUNITY): Payer: Self-pay

## 2015-08-06 ENCOUNTER — Inpatient Hospital Stay (HOSPITAL_COMMUNITY)
Admission: AD | Admit: 2015-08-06 | Discharge: 2015-08-06 | Disposition: A | Discharge status: Home / Self Care | Payer: Medicaid Other | Source: Ambulatory Visit | Attending: Obstetrics and Gynecology | Admitting: Obstetrics and Gynecology

## 2015-08-06 DIAGNOSIS — F909 Attention-deficit hyperactivity disorder, unspecified type: Secondary | ICD-10-CM | POA: Diagnosis not present

## 2015-08-06 DIAGNOSIS — I10 Essential (primary) hypertension: Secondary | ICD-10-CM | POA: Diagnosis not present

## 2015-08-06 DIAGNOSIS — Z87442 Personal history of urinary calculi: Secondary | ICD-10-CM | POA: Diagnosis not present

## 2015-08-06 DIAGNOSIS — F17210 Nicotine dependence, cigarettes, uncomplicated: Secondary | ICD-10-CM | POA: Diagnosis not present

## 2015-08-06 DIAGNOSIS — B9689 Other specified bacterial agents as the cause of diseases classified elsewhere: Secondary | ICD-10-CM

## 2015-08-06 DIAGNOSIS — F319 Bipolar disorder, unspecified: Secondary | ICD-10-CM | POA: Diagnosis not present

## 2015-08-06 DIAGNOSIS — N939 Abnormal uterine and vaginal bleeding, unspecified: Secondary | ICD-10-CM | POA: Diagnosis not present

## 2015-08-06 DIAGNOSIS — R109 Unspecified abdominal pain: Secondary | ICD-10-CM | POA: Diagnosis present

## 2015-08-06 DIAGNOSIS — A499 Bacterial infection, unspecified: Secondary | ICD-10-CM | POA: Diagnosis not present

## 2015-08-06 DIAGNOSIS — K589 Irritable bowel syndrome without diarrhea: Secondary | ICD-10-CM | POA: Diagnosis not present

## 2015-08-06 DIAGNOSIS — F419 Anxiety disorder, unspecified: Secondary | ICD-10-CM | POA: Insufficient documentation

## 2015-08-06 DIAGNOSIS — F209 Schizophrenia, unspecified: Secondary | ICD-10-CM | POA: Diagnosis not present

## 2015-08-06 DIAGNOSIS — N760 Acute vaginitis: Secondary | ICD-10-CM | POA: Insufficient documentation

## 2015-08-06 LAB — URINALYSIS, ROUTINE W REFLEX MICROSCOPIC
Bilirubin Urine: NEGATIVE
GLUCOSE, UA: NEGATIVE mg/dL
Ketones, ur: NEGATIVE mg/dL
Nitrite: NEGATIVE
PH: 6 (ref 5.0–8.0)
Protein, ur: NEGATIVE mg/dL
SPECIFIC GRAVITY, URINE: 1.025 (ref 1.005–1.030)

## 2015-08-06 LAB — URINE MICROSCOPIC-ADD ON

## 2015-08-06 LAB — WET PREP, GENITAL
SPERM: NONE SEEN
TRICH WET PREP: NONE SEEN
Yeast Wet Prep HPF POC: NONE SEEN

## 2015-08-06 LAB — HEPATITIS B SURFACE ANTIGEN: Hepatitis B Surface Ag: NEGATIVE

## 2015-08-06 LAB — POCT PREGNANCY, URINE: Preg Test, Ur: NEGATIVE

## 2015-08-06 LAB — HIV ANTIBODY (ROUTINE TESTING W REFLEX): HIV Screen 4th Generation wRfx: NONREACTIVE

## 2015-08-06 LAB — RPR: RPR Ser Ql: NONREACTIVE

## 2015-08-06 MED ORDER — METRONIDAZOLE 500 MG PO TABS: 500.0000 mg | ORAL_TABLET | Freq: Two times a day (BID) | ORAL | Status: DC

## 2015-08-06 NOTE — MAU Note (Signed)
Had spotting this morning and lower abdominal cramping, had unprotected sex wants STI testing. Patient on Depo is due for another in about 2 weeks.

## 2015-08-06 NOTE — MAU Provider Note (Signed)
History     CSN: [id number]  Arrival date and time: 08/06/15 0745   First Provider Initiated Contact with Patient 08/06/15 [phone]      Chief Complaint  Patient presents with  . Abdominal Pain   HPI [name] [name] 24 y.o. [id number] presents with complaint of mild lower abdominal cramping that started last night. She also said she was having some vaginal spotting that was not normal for her. She is due for depo in 2 weeks. She requests STD testing after having unprotected sex.  Past Medical History  Diagnosis Date  . History of hypertension no meds  . Left ureteral calculus     lithrotripsy  . Frequency of urination   . Urgency of urination   . Nocturia   . ADHD (attention deficit hyperactivity disorder)   . Depression   . Bipolar 1 disorder (HCC)   . Anxiety   . Schizophrenia (HCC)     MILD  . HTN in pregnancy, chronic     HX OF HTN; NO MEDS  . Renal disorder   . Hemorrhoid   . Kidney stone   . Herpes   . IBS (irritable bowel syndrome)   . Schizophrenia (HCC)   . Bipolar 1 disorder (HCC)   . ADHD (attention deficit hyperactivity disorder)     Past Surgical History  Procedure Laterality Date  . Wisdom tooth extraction  2009- oral md office  . Cystoscopy/retrograde/ureteroscopy  08/25/2011    Procedure: CYSTOSCOPY/RETROGRADE/URETEROSCOPY;  Surgeon: Antony Haste, MD;  Location: [facility];  Service: Urology;  Laterality: Left;  cystoscopy LEFT retrograde pyelogram LEFT URETEROSCOPY holmium  LASER LITHO AND BASKET EXTRACTION C-ARM LASER  . Kidney stone surgery  07/2011    9 MM  . Dilation and evacuation  12/29/2011    Procedure: DILATATION AND EVACUATION;  Surgeon: Purcell Nails, MD;  Location: [facility];  Service: Gynecology;  Laterality: N/A;    Family History  Problem Relation Age of Onset  . Heart disease Paternal Grandfather   . Heart disease Maternal Grandmother   . Hypertension Maternal Grandmother   . Other Mother     VARICOSE  VEINS  . Anxiety disorder Mother   . Diabetes Maternal Grandfather     Social History  Substance Use Topics  . Smoking status: Current Every Day Smoker -- 0.25 packs/day    Types: Cigarettes    Last Attempt to Quit: 11/12/2011  . Smokeless tobacco: Never Used  . Alcohol Use: Yes     Comment: RARELY DURING SOCIAL OCCASIONS    Allergies: No Known Allergies  Prescriptions prior to admission  Medication Sig Dispense Refill Last Dose  . baclofen (LIORESAL) 20 MG tablet Take 20 mg by mouth at bedtime.    Past Week at Unknown time  . clonazePAM (KLONOPIN) 0.5 MG tablet Take 1 tablet by mouth daily as needed for anxiety.   1 08/05/2015 at Unknown time  . ketorolac (TORADOL) 10 MG tablet Take 10 mg by mouth 2 (two) times daily as needed for moderate pain.    08/04/2015 at Unknown time  . ziprasidone (GEODON) 40 MG capsule Take 40 mg by mouth at bedtime.  1 Past Week at Unknown time  . hydrOXYzine (VISTARIL) 25 MG capsule Take 25 mg by mouth 2 (two) times daily as needed. For anxiety  0 prn  . medroxyPROGESTERone (DEPO-PROVERA) 150 MG/ML injection Inject 150 mg into the muscle every 3 (three) months.   oct 2016    Review of Systems  Constitutional: Negative for fever.  Gastrointestinal: Positive for abdominal pain.  Genitourinary:       Vaginal spotting  All other systems reviewed and are negative.  Physical Exam   Blood pressure 141/73, pulse 102, temperature 98.9 F (37.2 C), temperature source Oral, resp. rate 16, unknown if currently breastfeeding.  Physical Exam  Nursing note and vitals reviewed. Constitutional: She is oriented to person, place, and time. She appears well-developed and well-nourished. No distress.  HENT:  Head: Normocephalic and atraumatic.  Cardiovascular: Normal rate.   Respiratory: Effort normal and breath sounds normal. No respiratory distress.  GI: She exhibits no distension and no mass. There is no tenderness. There is no rebound and no guarding.   Genitourinary: Vaginal discharge found.  Small amount drk red blood at os  Musculoskeletal: Normal range of motion.  Neurological: She is alert and oriented to person, place, and time.  Skin: Skin is warm and dry.  Psychiatric: She has a normal mood and affect. Her behavior is normal. Judgment and thought content normal.   Results for orders placed or performed during the hospital encounter of 08/06/15 (from the past 24 hour(s))  Urinalysis, Routine w reflex microscopic (not at [facility])     Status: Abnormal   Collection Time: 08/06/15  7:55 AM  Result Value Ref Range   Color, Urine YELLOW YELLOW   APPearance CLEAR CLEAR   Specific Gravity, Urine 1.025 1.005 - 1.030   pH 6.0 5.0 - 8.0   Glucose, UA NEGATIVE NEGATIVE mg/dL   Hgb urine dipstick LARGE (A) NEGATIVE   Bilirubin Urine NEGATIVE NEGATIVE   Ketones, ur NEGATIVE NEGATIVE mg/dL   Protein, ur NEGATIVE NEGATIVE mg/dL   Nitrite NEGATIVE NEGATIVE   Leukocytes, UA TRACE (A) NEGATIVE  Urine microscopic-add on     Status: Abnormal   Collection Time: 08/06/15  7:55 AM  Result Value Ref Range   Squamous Epithelial / LPF 0-5 (A) NONE SEEN   WBC, UA 0-5 0 - 5 WBC/hpf   RBC / HPF 0-5 0 - 5 RBC/hpf   Bacteria, UA RARE (A) NONE SEEN  Pregnancy, urine POC     Status: None   Collection Time: 08/06/15  8:11 AM  Result Value Ref Range   Preg Test, Ur NEGATIVE NEGATIVE  Wet prep, genital     Status: Abnormal   Collection Time: 08/06/15  8:39 AM  Result Value Ref Range   Yeast Wet Prep HPF POC NONE SEEN NONE SEEN   Trich, Wet Prep NONE SEEN NONE SEEN   Clue Cells Wet Prep HPF POC PRESENT (A) NONE SEEN   WBC, Wet Prep HPF POC FEW (A) NONE SEEN   Sperm NONE SEEN    MAU Course  Procedures  MDM Std testing ordered. Will treat for Bacterial Vaginitis with Flagyl  Assessment and Plan  Vaginal bleeding Lower abdominal pain Bacterial vaginitis Flagyl  PO BID X 7 d When other lab results are available will treat  appropriately  Discharge  Clemmons,Lori Grissett 08/06/2015, 8:44 AM

## 2015-08-06 NOTE — Discharge Instructions (Signed)

## 2015-08-09 LAB — GC/CHLAMYDIA PROBE AMP (~~LOC~~) NOT AT ARMC
Chlamydia: NEGATIVE
Neisseria Gonorrhea: NEGATIVE

## 2015-08-13 ENCOUNTER — Telehealth: Payer: Self-pay | Admitting: *Deleted

## 2015-08-13 NOTE — Telephone Encounter (Addendum)
Pt left message stating that she was seen on 1/20 and would like her test results. I returned pt's call and heard message stating that all circuits are busy and to try the call again later. Pt can be informed that her wet prep showed +BV and she was already given the Rx for this at her visit.  All other tests (RPR, HIV, Hep B, GC/CHL) were negative.   1155  Pt called back and I advised her of the above test result information.  She stated that she was not able to take the final 2 pills of Flagyl because it was making her sick. I advised that she is probably ok and she should call us back if the sx reoccur. There are other medications that she can have in the future to treat this problem if she prefers. She voiced understanding.

## 2015-08-27 ENCOUNTER — Inpatient Hospital Stay (HOSPITAL_COMMUNITY)
Admission: AD | Admit: 2015-08-27 | Discharge: 2015-08-27 | Disposition: A | Discharge status: Home / Self Care | Payer: Medicaid Other | Source: Ambulatory Visit | Attending: Family Medicine | Admitting: Family Medicine

## 2015-08-27 ENCOUNTER — Encounter (HOSPITAL_COMMUNITY): Payer: Self-pay | Admitting: *Deleted

## 2015-08-27 DIAGNOSIS — F319 Bipolar disorder, unspecified: Secondary | ICD-10-CM | POA: Insufficient documentation

## 2015-08-27 DIAGNOSIS — F909 Attention-deficit hyperactivity disorder, unspecified type: Secondary | ICD-10-CM | POA: Diagnosis not present

## 2015-08-27 DIAGNOSIS — N390 Urinary tract infection, site not specified: Secondary | ICD-10-CM

## 2015-08-27 DIAGNOSIS — Z87442 Personal history of urinary calculi: Secondary | ICD-10-CM | POA: Diagnosis not present

## 2015-08-27 DIAGNOSIS — F17210 Nicotine dependence, cigarettes, uncomplicated: Secondary | ICD-10-CM | POA: Diagnosis not present

## 2015-08-27 DIAGNOSIS — F419 Anxiety disorder, unspecified: Secondary | ICD-10-CM | POA: Insufficient documentation

## 2015-08-27 DIAGNOSIS — B379 Candidiasis, unspecified: Secondary | ICD-10-CM | POA: Diagnosis not present

## 2015-08-27 DIAGNOSIS — I10 Essential (primary) hypertension: Secondary | ICD-10-CM | POA: Diagnosis not present

## 2015-08-27 LAB — URINE MICROSCOPIC-ADD ON

## 2015-08-27 LAB — URINALYSIS, ROUTINE W REFLEX MICROSCOPIC
BILIRUBIN URINE: NEGATIVE
Glucose, UA: NEGATIVE mg/dL
KETONES UR: NEGATIVE mg/dL
NITRITE: POSITIVE — AB
PROTEIN: NEGATIVE mg/dL
SPECIFIC GRAVITY, URINE: 1.02 (ref 1.005–1.030)
pH: 6 (ref 5.0–8.0)

## 2015-08-27 LAB — WET PREP, GENITAL
Clue Cells Wet Prep HPF POC: NONE SEEN
SPERM: NONE SEEN
Trich, Wet Prep: NONE SEEN
YEAST WET PREP: NONE SEEN

## 2015-08-27 LAB — POCT PREGNANCY, URINE: Preg Test, Ur: NEGATIVE

## 2015-08-27 MED ORDER — NITROFURANTOIN MONOHYD MACRO 100 MG PO CAPS
100.0000 mg | ORAL_CAPSULE | Freq: Two times a day (BID) | ORAL | Status: DC
Start: 2015-08-27 — End: 2018-04-16

## 2015-08-27 NOTE — Discharge Instructions (Signed)

## 2015-08-27 NOTE — MAU Note (Signed)
Pt presents to MAU with complaints of having frequent urination, burning with urination. Has a history of kidney stones.

## 2015-08-27 NOTE — MAU Provider Note (Signed)
History     CSN: [id number]  Arrival date and time: 08/27/15 1055   First Provider Initiated Contact with Patient 08/27/15 1130      No chief complaint on file.  HPI [name] [name] 24 y.o. [id number] nonpregnant female presents to MAU complaining of pressure when she urinates.  It is not burning - but uncomfortable.   Hemorrhoids worse.  Vaginal irritation.  H/o kidney stones, yeast, UTI.  She has not been drinking well.   Worries about STD's always.   OB History    Gravida Para Term Preterm AB TAB SAB Ectopic Multiple Living   Past Medical History  Diagnosis Date  . History of hypertension no meds  . Left ureteral calculus     lithrotripsy  . Frequency of urination   . Urgency of urination   . Nocturia   . ADHD (attention deficit hyperactivity disorder)   . Depression   . Bipolar 1 disorder (HCC)   . Anxiety   . Schizophrenia (HCC)     MILD  . HTN in pregnancy, chronic     HX OF HTN; NO MEDS  . Renal disorder   . Hemorrhoid   . Kidney stone   . Herpes   . IBS (irritable bowel syndrome)   . Schizophrenia (HCC)   . Bipolar 1 disorder (HCC)   . ADHD (attention deficit hyperactivity disorder)     Past Surgical History  Procedure Laterality Date  . Wisdom tooth extraction  2009- oral md office  . Cystoscopy/retrograde/ureteroscopy  08/25/2011    Procedure: CYSTOSCOPY/RETROGRADE/URETEROSCOPY;  Surgeon: Antony Haste, MD;  Location: [facility];  Service: Urology;  Laterality: Left;  cystoscopy LEFT retrograde pyelogram LEFT URETEROSCOPY holmium  LASER LITHO AND BASKET EXTRACTION C-ARM LASER  . Kidney stone surgery  07/2011    9 MM  . Dilation and evacuation  12/29/2011    Procedure: DILATATION AND EVACUATION;  Surgeon: Purcell Nails, MD;  Location: [facility];  Service: Gynecology;  Laterality: N/A;    Family History  Problem Relation Age of Onset  . Heart disease Paternal Grandfather   . Heart disease Maternal  Grandmother   . Hypertension Maternal Grandmother   . Other Mother     VARICOSE VEINS  . Anxiety disorder Mother   . Diabetes Maternal Grandfather     Social History  Substance Use Topics  . Smoking status: Current Every Day Smoker -- 0.25 packs/day    Types: Cigarettes    Last Attempt to Quit: 11/12/2011  . Smokeless tobacco: Never Used  . Alcohol Use: Yes     Comment: RARELY DURING SOCIAL OCCASIONS    Allergies: No Known Allergies  Prescriptions prior to admission  Medication Sig Dispense Refill Last Dose  . medroxyPROGESTERone (DEPO-PROVERA) 150 MG/ML injection Inject 150 mg into the muscle every 3 (three) months.   oct 2016  . metroNIDAZOLE (FLAGYL) 500 MG tablet Take 1 tablet (500 mg total) by mouth 2 (two) times daily. 14 tablet 0   . ziprasidone (GEODON) 40 MG capsule Take 40 mg by mouth at bedtime.  1 Past Week at Unknown time    ROS Pertinent ROS in HPI.  All other systems are negative.   Physical Exam   Blood pressure 171/92, pulse 108, temperature 98 F (36.7 C), resp. rate 18, last menstrual period 08/04/2015, unknown if currently breastfeeding.  Physical Exam  Constitutional: She is oriented to person, place,  and time. She appears well-developed and well-nourished. No distress.  HENT:  Head: Normocephalic and atraumatic.  Eyes: Conjunctivae and EOM are normal.  Neck: Normal range of motion. Neck supple.  Cardiovascular: Normal rate.   Respiratory: Effort normal. No respiratory distress.  GI: Soft. She exhibits no distension.  Genitourinary:  Scant white discharge.  No CMT.  NO adnexal mass or tenderness  Musculoskeletal: Normal range of motion.  Neurological: She is alert and oriented to person, place, and time.  Skin: Skin is warm and dry.  Psychiatric: She has a normal mood and affect. Her behavior is normal.    MAU Course  Procedures  MDM Results for orders placed or performed during the hospital encounter of 08/27/15 (from the past 72 hour(s))   Urinalysis, Routine w reflex microscopic (not at [facility])     Status: Abnormal   Collection Time: 08/27/15 11:06 AM  Result Value Ref Range   Color, Urine YELLOW YELLOW   APPearance HAZY (A) CLEAR   Specific Gravity, Urine 1.020 1.005 - 1.030   pH 6.0 5.0 - 8.0   Glucose, UA NEGATIVE NEGATIVE mg/dL   Hgb urine dipstick TRACE (A) NEGATIVE   Bilirubin Urine NEGATIVE NEGATIVE   Ketones, ur NEGATIVE NEGATIVE mg/dL   Protein, ur NEGATIVE NEGATIVE mg/dL   Nitrite POSITIVE (A) NEGATIVE   Leukocytes, UA SMALL (A) NEGATIVE  Urine microscopic-add on     Status: Abnormal   Collection Time: 08/27/15 11:06 AM  Result Value Ref Range   Squamous Epithelial / LPF 6-30 (A) NONE SEEN   WBC, UA 6-30 0 - 5 WBC/hpf   RBC / HPF 0-5 0 - 5 RBC/hpf   Bacteria, UA MANY (A) NONE SEEN  Pregnancy, urine POC     Status: None   Collection Time: 08/27/15 11:33 AM  Result Value Ref Range   Preg Test, Ur NEGATIVE NEGATIVE    Comment:        THE SENSITIVITY OF THIS METHODOLOGY IS >24 mIU/mL   Wet prep, genital     Status: Abnormal   Collection Time: 08/27/15 11:40 AM  Result Value Ref Range   Yeast Wet Prep HPF POC NONE SEEN NONE SEEN   Trich, Wet Prep NONE SEEN NONE SEEN   Clue Cells Wet Prep HPF POC NONE SEEN NONE SEEN   WBC, Wet Prep HPF POC MODERATE (A) NONE SEEN    Comment: MANY BACTERIA SEEN   Sperm NONE SEEN      Assessment and Plan  A: UTI  P: Discharge to home Macrobid rx sent in.  Increase water intake F/u in clinic or with urology Patient may return to MAU as needed or if her condition were to change or worsen   Bertram Denver 08/27/2015, 11:31 AM

## 2015-08-30 LAB — GC/CHLAMYDIA PROBE AMP (~~LOC~~) NOT AT ARMC
CHLAMYDIA, DNA PROBE: NEGATIVE
NEISSERIA GONORRHEA: NEGATIVE

## 2018-04-16 ENCOUNTER — Other Ambulatory Visit: Payer: Self-pay

## 2018-04-16 ENCOUNTER — Inpatient Hospital Stay (HOSPITAL_COMMUNITY)
Admission: AD | Admit: 2018-04-16 | Discharge: 2018-04-16 | Disposition: A | Discharge status: Home / Self Care | Payer: Self-pay | Source: Ambulatory Visit | Attending: Family Medicine | Admitting: Family Medicine

## 2018-04-16 ENCOUNTER — Encounter (HOSPITAL_COMMUNITY): Payer: Self-pay | Admitting: *Deleted

## 2018-04-16 DIAGNOSIS — B3731 Acute candidiasis of vulva and vagina: Secondary | ICD-10-CM

## 2018-04-16 DIAGNOSIS — B9689 Other specified bacterial agents as the cause of diseases classified elsewhere: Secondary | ICD-10-CM | POA: Insufficient documentation

## 2018-04-16 DIAGNOSIS — F17210 Nicotine dependence, cigarettes, uncomplicated: Secondary | ICD-10-CM | POA: Insufficient documentation

## 2018-04-16 DIAGNOSIS — N760 Acute vaginitis: Secondary | ICD-10-CM | POA: Insufficient documentation

## 2018-04-16 DIAGNOSIS — Z3202 Encounter for pregnancy test, result negative: Secondary | ICD-10-CM | POA: Insufficient documentation

## 2018-04-16 DIAGNOSIS — B373 Candidiasis of vulva and vagina: Secondary | ICD-10-CM | POA: Insufficient documentation

## 2018-04-16 LAB — WET PREP, GENITAL
Sperm: NONE SEEN
Trich, Wet Prep: NONE SEEN

## 2018-04-16 LAB — URINALYSIS, ROUTINE W REFLEX MICROSCOPIC
BILIRUBIN URINE: NEGATIVE
Glucose, UA: NEGATIVE mg/dL
HGB URINE DIPSTICK: NEGATIVE
KETONES UR: NEGATIVE mg/dL
Leukocytes, UA: NEGATIVE
Nitrite: NEGATIVE
Protein, ur: NEGATIVE mg/dL
SPECIFIC GRAVITY, URINE: 1.017 (ref 1.005–1.030)
pH: 7 (ref 5.0–8.0)

## 2018-04-16 LAB — POCT PREGNANCY, URINE: PREG TEST UR: NEGATIVE

## 2018-04-16 MED ORDER — METRONIDAZOLE 500 MG PO TABS: 500.0000 mg | ORAL_TABLET | Freq: Two times a day (BID) | ORAL | 0 refills | Status: DC

## 2018-04-16 MED ORDER — FLUCONAZOLE 150 MG PO TABS: 150.0000 mg | ORAL_TABLET | Freq: Every day | ORAL | 0 refills | Status: DC

## 2018-04-16 NOTE — MAU Provider Note (Signed)
History     CSN: [id number]  Arrival date and time: 04/16/18 1029   First Provider Initiated Contact with Patient 04/16/18 1053      Chief Complaint  Patient presents with  . Vaginal Discharge  . Vaginal Itching   HPI  [name] is a 27 y.o. [id number] who presents to MAU today with complaint of white discharge for the last few days. Also had spotting recently, but denies bleeding today. She denies pelvic pain, odor, UTI symptoms or fever. She is sexually active and does not use protection.     OB History    Gravida  3   Para  2   Term      Preterm  2   AB  1   Living  2     SAB  1   TAB      Ectopic      Multiple      Live Births  2           Past Medical History:  Diagnosis Date  . ADHD (attention deficit hyperactivity disorder)   . ADHD (attention deficit hyperactivity disorder)   . Anxiety   . Bipolar 1 disorder (HCC)   . Bipolar 1 disorder (HCC)   . Depression   . Frequency of urination   . Hemorrhoid   . Herpes   . History of hypertension no meds  . HTN in pregnancy, chronic    HX OF HTN; NO MEDS  . IBS (irritable bowel syndrome)   . Kidney stone   . Left ureteral calculus    lithrotripsy  . Nocturia   . Renal disorder   . Schizophrenia (HCC)    MILD  . Schizophrenia (HCC)   . Urgency of urination     Past Surgical History:  Procedure Laterality Date  . CYSTOSCOPY/RETROGRADE/URETEROSCOPY  08/25/2011   Procedure: CYSTOSCOPY/RETROGRADE/URETEROSCOPY;  Surgeon: Antony Haste, MD;  Location: [facility];  Service: Urology;  Laterality: Left;  cystoscopy LEFT retrograde pyelogram LEFT URETEROSCOPY holmium  LASER LITHO AND BASKET EXTRACTION C-ARM LASER  . DILATION AND EVACUATION  12/29/2011   Procedure: DILATATION AND EVACUATION;  Surgeon: Purcell Nails, MD;  Location: [facility];  Service: Gynecology;  Laterality: N/A;  . KIDNEY STONE SURGERY  07/2011   9 MM  . WISDOM TOOTH EXTRACTION  2009- oral md  office    Family History  Problem Relation Age of Onset  . Heart disease Paternal Grandfather   . Heart disease Maternal Grandmother   . Hypertension Maternal Grandmother   . Other Mother        VARICOSE VEINS  . Anxiety disorder Mother   . Diabetes Maternal Grandfather     Social History   Tobacco Use  . Smoking status: Current Every Day Smoker    Packs/day: 0.25    Types: Cigarettes    Last attempt to quit: 11/12/2011    Years since quitting: 6.4  . Smokeless tobacco: Never Used  Substance Use Topics  . Alcohol use: Yes    Comment: RARELY DURING SOCIAL OCCASIONS  . Drug use: No    Allergies: No Known Allergies  Medications Prior to Admission  Medication Sig Dispense Refill Last Dose  . buPROPion (WELLBUTRIN XL) 150 MG 24 hr tablet Take 150 mg by mouth daily.   08/27/2015 at Unknown time  . clonazePAM (KLONOPIN) 1 MG tablet Take 1 mg by mouth 2 (two) times daily as needed for anxiety.   08/27/2015 at Unknown time  .  dicyclomine (BENTYL) 20 MG tablet Take 20 mg by mouth 3 (three) times daily before meals.   08/26/2015 at Unknown time  . gabapentin (NEURONTIN) 300 MG capsule Take 300 mg by mouth at bedtime.   Past Week at Unknown time  . medroxyPROGESTERone (DEPO-PROVERA) 150 MG/ML injection Inject 150 mg into the muscle every 3 (three) months.   08/04/2015  . methocarbamol (ROBAXIN) 500 MG tablet Take 500 mg by mouth 3 (three) times daily.   08/26/2015 at Unknown time  . nitrofurantoin, macrocrystal-monohydrate, (MACROBID) 100 MG capsule Take 1 capsule (100 mg total) by mouth 2 (two) times daily. 14 capsule 0   . ziprasidone (GEODON) 40 MG capsule Take 40 mg by mouth at bedtime.  1 Past Month at Unknown time    Review of Systems  Constitutional: Negative for fever.  Gastrointestinal: Negative for abdominal pain, constipation, diarrhea, nausea and vomiting.  Genitourinary: Positive for vaginal bleeding and vaginal discharge. Negative for dysuria, frequency and urgency.    Physical Exam   Blood pressure 136/80, pulse 69, temperature 98.8 F (37.1 C), temperature source Oral, resp. rate 17, weight 103.5 kg, last menstrual period 04/05/2018, SpO2 99 %, unknown if currently breastfeeding.  Physical Exam  Nursing note and vitals reviewed. Constitutional: She is oriented to person, place, and time. She appears well-developed and well-nourished. No distress.  HENT:  Head: Normocephalic and atraumatic.  Cardiovascular: Normal rate.  Respiratory: Effort normal.  GI: Soft. She exhibits no distension and no mass. There is no tenderness. There is no rebound and no guarding.  Genitourinary: Uterus is not enlarged and not tender. Cervix exhibits no motion tenderness, no discharge and no friability. Right adnexum displays no mass and no tenderness. Left adnexum displays no mass and no tenderness. No bleeding in the vagina. Vaginal discharge (small, white, no odor) found.  Neurological: She is alert and oriented to person, place, and time.  Skin: Skin is warm and dry. No erythema.  Psychiatric: She has a normal mood and affect.    Results for orders placed or performed during the hospital encounter of 04/16/18 (from the past 24 hour(s))  Urinalysis, Routine w reflex microscopic     Status: Abnormal   Collection Time: 04/16/18 10:50 AM  Result Value Ref Range   Color, Urine YELLOW YELLOW   APPearance HAZY (A) CLEAR   Specific Gravity, Urine 1.017 1.005 - 1.030   pH 7.0 5.0 - 8.0   Glucose, UA NEGATIVE NEGATIVE mg/dL   Hgb urine dipstick NEGATIVE NEGATIVE   Bilirubin Urine NEGATIVE NEGATIVE   Ketones, ur NEGATIVE NEGATIVE mg/dL   Protein, ur NEGATIVE NEGATIVE mg/dL   Nitrite NEGATIVE NEGATIVE   Leukocytes, UA NEGATIVE NEGATIVE  Pregnancy, urine POC     Status: None   Collection Time: 04/16/18 10:54 AM  Result Value Ref Range   Preg Test, Ur NEGATIVE NEGATIVE  Wet prep, genital     Status: Abnormal   Collection Time: 04/16/18 11:00 AM  Result Value Ref Range    Yeast Wet Prep HPF POC PRESENT (A) NONE SEEN   Trich, Wet Prep NONE SEEN NONE SEEN   Clue Cells Wet Prep HPF POC PRESENT (A) NONE SEEN   WBC, Wet Prep HPF POC MANY (A) NONE SEEN   Sperm NONE SEEN     MAU Course  Procedures None  MDM UPT - negative UA, wet prep, GC/Chlamydia today   Assessment and Plan  A: Bacterial vaginosis  Yeast vulvovaginitis  P: Discharge home Rx for Flagyl and Diflucan sent  to patient's pharmacy  Warning signs for worsening condition discussed Patient advised to follow-up with [facility] as needed for GYN concerns Patient may return to MAU as needed or if her condition were to change or worsen   Vonzella Nipple, PA-C 04/16/2018, 11:35 AM

## 2018-04-16 NOTE — Discharge Instructions (Signed)
Bacterial Vaginosis Bacterial vaginosis is an infection of the vagina. It happens when too many germs (bacteria) grow in the vagina. This infection puts you at risk for infections from sex (STIs). Treating this infection can lower your risk for some STIs. You should also treat this if you are pregnant. It can cause your baby to be born early. Follow these instructions at home: Medicines  Take over-the-counter and prescription medicines only as told by your doctor.  Take or use your antibiotic medicine as told by your doctor. Do not stop taking or using it even if you start to feel better. General instructions  If you your sexual partner is a woman, tell her that you have this infection. She needs to get treatment if she has symptoms. If you have a female partner, he does not need to be treated.  During treatment: ? Avoid sex. ? Do not douche. ? Avoid alcohol as told. ? Avoid breastfeeding as told.  Drink enough fluid to keep your pee (urine) clear or pale yellow.  Keep your vagina and butt (rectum) clean. ? Wash the area with warm water every day. ? Wipe from front to back after you use the toilet.  Keep all follow-up visits as told by your doctor. This is important. Preventing this condition  Do not douche.  Use only warm water to wash around your vagina.  Use protection when you have sex. This includes: ? Latex condoms. ? Dental dams.  Limit how many people you have sex with. It is best to only have sex with the same person (be monogamous).  Get tested for STIs. Have your partner get tested.  Wear underwear that is cotton or lined with cotton.  Avoid tight pants and pantyhose. This is most important in summer.  Do not use any products that have nicotine or tobacco in them. These include cigarettes and e-cigarettes. If you need help quitting, ask your doctor.  Do not use illegal drugs.  Limit how much alcohol you drink. Contact a doctor if:  Your symptoms do not get  better, even after you are treated.  You have more discharge or pain when you pee (urinate).  You have a fever.  You have pain in your belly (abdomen).  You have pain with sex.  Your bleed from your vagina between periods. Summary  This infection happens when too many germs (bacteria) grow in the vagina.  Treating this condition can lower your risk for some infections from sex (STIs).  You should also treat this if you are pregnant. It can cause early (premature) birth.  Do not stop taking or using your antibiotic medicine even if you start to feel better. This information is not intended to replace advice given to you by your health care provider. Make sure you discuss any questions you have with your health care provider. Document Released: 04/11/2008 Document Revised: 03/18/2016 Document Reviewed: 03/18/2016 Elsevier Interactive Patient Education  2017 [employer] Vaginal Yeast infection, Adult Vaginal yeast infection is a condition that causes soreness, swelling, and redness (inflammation) of the vagina. It also causes vaginal discharge. This is a common condition. Some women get this infection frequently. What are the causes? This condition is caused by a change in the normal balance of the yeast (candida) and bacteria that live in the vagina. This change causes an overgrowth of yeast, which causes the inflammation. What increases the risk? This condition is more likely to develop in:  Women who take antibiotic medicines.  Women who have   diabetes.  Women who take birth control pills.  Women who are pregnant.  Women who douche often.  Women who have a weak defense (immune) system.  Women who have been taking steroid medicines for a long time.  Women who frequently wear tight clothing.  What are the signs or symptoms? Symptoms of this condition include:  White, thick vaginal discharge.  Swelling, itching, redness, and irritation of the vagina. The lips of the  vagina (vulva) may be affected as well.  Pain or a burning feeling while urinating.  Pain during sex.  How is this diagnosed? This condition is diagnosed with a medical history and physical exam. This will include a pelvic exam. Your health care provider will examine a sample of your vaginal discharge under a microscope. Your health care provider may send this sample for testing to confirm the diagnosis. How is this treated? This condition is treated with medicine. Medicines may be over-the-counter or prescription. You may be told to use one or more of the following:  Medicine that is taken orally.  Medicine that is applied as a cream.  Medicine that is inserted directly into the vagina (suppository).  Follow these instructions at home:  Take or apply over-the-counter and prescription medicines only as told by your health care provider.  Do not have sex until your health care provider has approved. Tell your sex partner that you have a yeast infection. That person should go to his or her health care provider if he or she develops symptoms.  Do not wear tight clothes, such as pantyhose or tight pants.  Avoid using tampons until your health care provider approves.  Eat more yogurt. This may help to keep your yeast infection from returning.  Try taking a sitz bath to help with discomfort. This is a warm water bath that is taken while you are sitting down. The water should only come up to your hips and should cover your buttocks. Do this 3-4 times per day or as told by your health care provider.  Do not douche.  Wear breathable, cotton underwear.  If you have diabetes, keep your blood sugar levels under control. Contact a health care provider if:  You have a fever.  Your symptoms go away and then return.  Your symptoms do not get better with treatment.  Your symptoms get worse.  You have new symptoms.  You develop blisters in or around your vagina.  You have blood coming  from your vagina and it is not your menstrual period.  You develop pain in your abdomen. This information is not intended to replace advice given to you by your health care provider. Make sure you discuss any questions you have with your health care provider. Document Released: 04/12/2005 Document Revised: 12/15/2015 Document Reviewed: 01/04/2015 Elsevier Interactive Patient Education  2018 [employer] Inc[employer]  

## 2018-04-16 NOTE — MAU Note (Signed)
Having some issues.  Got off bc last year in Nov, had a period in 6 mon.  Then noted an odor, tired summer's eve.  Now has d/c, some itching, no odor now, some redness.

## 2018-04-17 LAB — GC/CHLAMYDIA PROBE AMP (~~LOC~~) NOT AT ARMC
CHLAMYDIA, DNA PROBE: NEGATIVE
NEISSERIA GONORRHEA: POSITIVE — AB

## 2018-04-18 ENCOUNTER — Encounter (HOSPITAL_COMMUNITY): Payer: Self-pay | Admitting: *Deleted

## 2018-04-18 ENCOUNTER — Emergency Department (HOSPITAL_COMMUNITY)
Admission: EM | Admit: 2018-04-18 | Discharge: 2018-04-18 | Disposition: A | Discharge status: Home / Self Care | Payer: Medicaid Other | Attending: Emergency Medicine | Admitting: Emergency Medicine

## 2018-04-18 ENCOUNTER — Other Ambulatory Visit: Payer: Self-pay

## 2018-04-18 DIAGNOSIS — A549 Gonococcal infection, unspecified: Secondary | ICD-10-CM | POA: Insufficient documentation

## 2018-04-18 DIAGNOSIS — A64 Unspecified sexually transmitted disease: Secondary | ICD-10-CM

## 2018-04-18 DIAGNOSIS — Z79899 Other long term (current) drug therapy: Secondary | ICD-10-CM | POA: Insufficient documentation

## 2018-04-18 DIAGNOSIS — I10 Essential (primary) hypertension: Secondary | ICD-10-CM | POA: Insufficient documentation

## 2018-04-18 DIAGNOSIS — F17210 Nicotine dependence, cigarettes, uncomplicated: Secondary | ICD-10-CM | POA: Insufficient documentation

## 2018-04-18 MED ORDER — STERILE WATER FOR INJECTION IJ SOLN
INTRAMUSCULAR | Status: AC
  Filled 2018-04-18: qty 10

## 2018-04-18 MED ORDER — AZITHROMYCIN 250 MG PO TABS
1000.0000 mg | ORAL_TABLET | Freq: Once | ORAL | Status: AC
  Administered 2018-04-18: 1000 mg via ORAL
  Filled 2018-04-18: qty 4

## 2018-04-18 MED ORDER — CEFTRIAXONE SODIUM 250 MG IJ SOLR
250.0000 mg | Freq: Once | INTRAMUSCULAR | Status: AC
  Administered 2018-04-18: 250 mg via INTRAMUSCULAR
  Filled 2018-04-18: qty 250

## 2018-04-18 NOTE — Discharge Instructions (Signed)
Return if any problems.

## 2018-04-18 NOTE — ED Provider Notes (Signed)
[facility] EMERGENCY DEPARTMENT Provider Note   CSN: [id number] Arrival date & time: 04/18/18  2012     History   Chief Complaint Chief Complaint  Patient presents with  . SEXUALLY TRANSMITTED DISEASE    HPI [name] [name] is a 27 y.o. female.  The history is provided by the patient. No language interpreter was used.  Exposure to STD  This is a new problem. The current episode started more than 2 days ago. The problem occurs constantly. The problem has not changed since onset.Nothing aggravates the symptoms. Nothing relieves the symptoms. She has tried nothing for the symptoms. The treatment provided no relief.  Pt seen at [facility] hospital and had a positive test for gonorrhea.  Pt request treatment.   Past Medical History:  Diagnosis Date  . ADHD (attention deficit hyperactivity disorder)   . ADHD (attention deficit hyperactivity disorder)   . Anxiety   . Bipolar 1 disorder (HCC)   . Bipolar 1 disorder (HCC)   . Depression   . Frequency of urination   . Hemorrhoid   . Herpes   . History of hypertension no meds  . HTN in pregnancy, chronic    HX OF HTN; NO MEDS  . IBS (irritable bowel syndrome)   . Kidney stone   . Left ureteral calculus    lithrotripsy  . Nocturia   . Renal disorder   . Schizophrenia (HCC)    MILD  . Schizophrenia (HCC)   . Urgency of urination     Patient Active Problem List   Diagnosis Date Noted  . 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 Vaginal delivery 11/06/2013  . Schizoaffective disorder (HCC) 11/05/2013  . Hx of pre-eclampsia in prior pregnancy, currently pregnant 11/05/2013  . History of posttraumatic stress disorder (PTSD) 11/05/2013  . 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 Hx Renal stones 11/05/2013  . Missed abortion 12/28/2011  . Obesity 12/28/2011  . 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 CYSTOSCOPY/RETROGRADE/URETEROSCOPY  08/25/2011   Procedure:  CYSTOSCOPY/RETROGRADE/URETEROSCOPY;  Surgeon: Antony Haste, MD;  Location: [facility];  Service: Urology;  Laterality: Left;  cystoscopy LEFT retrograde pyelogram LEFT URETEROSCOPY holmium  LASER LITHO AND BASKET EXTRACTION C-ARM LASER  . DILATION AND EVACUATION  12/29/2011   Procedure: DILATATION AND EVACUATION;  Surgeon: Purcell Nails, MD;  Location: [facility];  Service: Gynecology;  Laterality: N/A;  . KIDNEY STONE SURGERY  07/2011   9 MM  . WISDOM TOOTH EXTRACTION  2009- oral md office     OB History    Gravida  3   Para  2   Term      Preterm  2   AB  1   Living  2     SAB  1   TAB      Ectopic      Multiple      Live Births  2            Home Medications    Prior to Admission medications   Medication Sig Start Date End Date Taking? Authorizing Provider  buPROPion (WELLBUTRIN XL) 150 MG 24 hr tablet Take 150 mg by mouth daily.    [provider]  clonazePAM (KLONOPIN) 1 MG tablet Take 1 mg by mouth 2 (two) times daily as needed for anxiety.    [provider]  dicyclomine (BENTYL) 20 MG tablet Take 20 mg by mouth 3 (three) times daily before meals.  [provider]  fluconazole (DIFLUCAN) 150 MG tablet Take 1 tablet (150 mg total) by mouth daily. 04/16/18   Marny Lowenstein, PA-C  gabapentin (NEURONTIN) 300 MG capsule Take 300 mg by mouth at bedtime.    [provider]  methocarbamol (ROBAXIN) 500 MG tablet Take 500 mg by mouth 3 (three) times daily.    [provider]  metroNIDAZOLE (FLAGYL) 500 MG tablet Take 1 tablet (500 mg total) by mouth 2 (two) times daily. 04/16/18   Marny Lowenstein, PA-C  ziprasidone (GEODON) 40 MG capsule Take 40 mg by mouth at bedtime. 04/11/15   [provider]    Family History Family History  Problem Relation Age of Onset  . Heart disease Paternal Grandfather   . Heart disease Maternal Grandmother   . Hypertension Maternal Grandmother   . Other  Mother        VARICOSE VEINS  . Anxiety disorder Mother   . Diabetes Maternal Grandfather     Social History Social History   Tobacco Use  . Smoking status: Current Every Day Smoker    Packs/day: 0.25    Types: Cigarettes    Last attempt to quit: 11/12/2011    Years since quitting: 6.4  . Smokeless tobacco: Never Used  Substance Use Topics  . Alcohol use: Yes    Comment: RARELY DURING SOCIAL OCCASIONS  . Drug use: No     Allergies   Patient has no known allergies.   Review of Systems Review of Systems  Genitourinary: Positive for vaginal discharge.  All other systems reviewed and are negative.    Physical Exam Updated Vital Signs BP 124/87 (BP Location: Right Arm)   Pulse (!) 59   Temp 98.4 F (36.9 C) (Oral)   Resp 20   LMP 04/05/2018   SpO2 100%   Physical Exam  Constitutional: She appears well-developed and well-nourished.  HENT:  Head: Normocephalic.  Eyes: Pupils are equal, round, and reactive to light.  Cardiovascular: Normal rate.  Pulmonary/Chest: Effort normal.  Neurological: She is alert.  Skin: Skin is warm.  Psychiatric: She has a normal mood and affect.     ED Treatments / Results  Labs (all labs ordered are listed, but only abnormal results are displayed) Labs Reviewed - No data to display  EKG None  Radiology No results found.  Procedures Procedures (including critical care time)  Medications Ordered in ED Medications  sterile water (preservative free) injection (has no administration in time range)  cefTRIAXone (ROCEPHIN) injection 250 mg (250 mg Intramuscular Given 04/18/18 2129)  azithromycin (ZITHROMAX) tablet 1,000 mg (1,000 mg Oral Given 04/18/18 2129)     Initial Impression / Assessment and Plan / ED Course  I have reviewed the triage vital signs and the nursing notes.  Pertinent labs & imaging results that were available during my care of the patient were reviewed by me and considered in my medical decision making  (see chart for details).     [facility]  Pt given injection of Rocephin and zithromax.   Final Clinical Impressions(s) / ED Diagnoses   Final diagnoses:  STD (sexually transmitted disease)  Gonorrhea    ED Discharge Orders    None     Meds ordered this encounter  Medications  . cefTRIAXone (ROCEPHIN) injection 250 mg    Order Specific Question:   Antibiotic Indication:    Answer:   STD  . azithromycin (ZITHROMAX) tablet 1,000 mg  . sterile water (preservative  free) injection    Wall, Yvette   : cabinet override  An After Visit Summary was printed and given to the patient.    Osie Cheeks 04/18/18 2229    Benjiman Core, MD 04/18/18 [phone]

## 2018-04-18 NOTE — ED Triage Notes (Signed)
Pt was told by [facility] to come to ED to be treated for a positive gonorrhea test; pt states she has some discharge and is being treated for a vaginal infection and yeast infection

## 2019-03-19 ENCOUNTER — Other Ambulatory Visit: Payer: Self-pay | Admitting: Orthopedic Surgery

## 2019-03-19 DIAGNOSIS — T148XXA Other injury of unspecified body region, initial encounter: Secondary | ICD-10-CM

## 2019-03-25 ENCOUNTER — Encounter (HOSPITAL_BASED_OUTPATIENT_CLINIC_OR_DEPARTMENT_OTHER): Payer: Self-pay | Admitting: *Deleted

## 2019-03-25 ENCOUNTER — Other Ambulatory Visit: Payer: Self-pay

## 2019-03-25 NOTE — H&P (Signed)
[facility] [address] [place], [place] [address] [phone] A Division of [facility]                                        RE: [name] [name], [name] [name]  [id number]   1991-05-23 03-19-19 [name] [name] is a 28 year-old female who presents for evaluation of low back hematoma.  Patient fell down brick stairs approximately one month ago and landed on her back.  Patient notes after this large hematoma formed she had significant bruising of her low back.  She notes it was initially very painful, but the pain has improved.  She has no associated numbness or tingling.  No radiation of pain down her legs.  She has no numbness or tingling in her legs.  Patient was seen by her primary care physician and referred her here for drainage.   Past medical history: None. Past surgical history: None. Allergies: No known drug allergies. Current medications: Ibuprofen as needed for pain, but no other medications.   Family history: Positive for hypertension and coronary artery disease. Social history: She is a nonsmoker, but does vape.   Review of systems: Negative other than what is mentioned in the HPI.   EXAMINATION: Height: 5?8.  Weight: 235 pounds.  Blood pressure: 114/87.  Examination of her lumbar spine reveals a very large, softball sized, hematoma over her lumbar spine.  Minimally tender to palpation.  She has no overlying bruising at this time.  Normal range of motion in the lumbar spine with forward flexion, extension, lateral bending and rotation.  Negative slump test bilaterally.      X-RAYS: Limited diagnostic ultrasound was done of her low back showing a very large hematoma.  Unable to accurately measure the borders of the hematoma.  Had a light gray appearance suggesting partially coagulated hematoma.  ASSESSMENT: Injury of her lumbar spine with a large hematoma.  PLAN: Given the fact that the hematoma is approximately one month  old and the ultrasound findings not showing a fluid collection that would be easily drainable with a needle in the office, patient will be scheduled for surgical evaluation of the hematoma.  She will first be scheduled for a CT scan to more accurately evaluate the depth and size of the hematoma.  CT scan will also be used to rule out concurrent fracture of her lumbar spine.  Assuming there are no fractures patient will be scheduled likely next week for surgery.  She will follow up after this.  [name] [name].  [name] [name], M.D. Dictated by: [name] [name], M.D.  Electronically verified by [name] [name]. [name] [name], M.D. TDM(KA):jjh D 03-20-19  T 03-21-19 cc:  [facility]  fax [phone]

## 2019-03-26 ENCOUNTER — Ambulatory Visit
Admission: RE | Admit: 2019-03-26 | Discharge: 2019-03-26 | Disposition: A | Discharge status: Home / Self Care | Payer: Medicaid Other | Source: Ambulatory Visit | Attending: Orthopedic Surgery | Admitting: Orthopedic Surgery

## 2019-03-26 DIAGNOSIS — T148XXA Other injury of unspecified body region, initial encounter: Secondary | ICD-10-CM

## 2019-03-28 ENCOUNTER — Other Ambulatory Visit (HOSPITAL_COMMUNITY)
Admission: RE | Admit: 2019-03-28 | Discharge: 2019-03-28 | Disposition: A | Discharge status: Home / Self Care | Payer: Medicaid Other | Source: Ambulatory Visit | Attending: Orthopedic Surgery | Admitting: Orthopedic Surgery

## 2019-03-28 ENCOUNTER — Other Ambulatory Visit: Payer: Self-pay

## 2019-03-28 DIAGNOSIS — Z20828 Contact with and (suspected) exposure to other viral communicable diseases: Secondary | ICD-10-CM | POA: Diagnosis not present

## 2019-03-28 DIAGNOSIS — Z01812 Encounter for preprocedural laboratory examination: Secondary | ICD-10-CM | POA: Insufficient documentation

## 2019-03-29 LAB — NOVEL CORONAVIRUS, NAA (HOSP ORDER, SEND-OUT TO REF LAB; TAT 18-24 HRS): SARS-CoV-2, NAA: NOT DETECTED

## 2019-04-01 ENCOUNTER — Ambulatory Visit (HOSPITAL_BASED_OUTPATIENT_CLINIC_OR_DEPARTMENT_OTHER): Payer: Medicaid Other | Admitting: Anesthesiology

## 2019-04-01 ENCOUNTER — Encounter (HOSPITAL_BASED_OUTPATIENT_CLINIC_OR_DEPARTMENT_OTHER)
Admission: RE | Disposition: A | Discharge status: Home / Self Care | Payer: Self-pay | Source: Home / Self Care | Attending: Orthopedic Surgery

## 2019-04-01 ENCOUNTER — Ambulatory Visit (HOSPITAL_BASED_OUTPATIENT_CLINIC_OR_DEPARTMENT_OTHER)
Admission: RE | Admit: 2019-04-01 | Discharge: 2019-04-01 | Disposition: A | Discharge status: Home / Self Care | Payer: Medicaid Other | Attending: Orthopedic Surgery | Admitting: Orthopedic Surgery

## 2019-04-01 ENCOUNTER — Other Ambulatory Visit: Payer: Self-pay

## 2019-04-01 ENCOUNTER — Encounter (HOSPITAL_BASED_OUTPATIENT_CLINIC_OR_DEPARTMENT_OTHER): Payer: Self-pay

## 2019-04-01 DIAGNOSIS — Z8249 Family history of ischemic heart disease and other diseases of the circulatory system: Secondary | ICD-10-CM | POA: Insufficient documentation

## 2019-04-01 DIAGNOSIS — I10 Essential (primary) hypertension: Secondary | ICD-10-CM | POA: Diagnosis not present

## 2019-04-01 DIAGNOSIS — Z87891 Personal history of nicotine dependence: Secondary | ICD-10-CM | POA: Diagnosis not present

## 2019-04-01 DIAGNOSIS — F419 Anxiety disorder, unspecified: Secondary | ICD-10-CM | POA: Insufficient documentation

## 2019-04-01 DIAGNOSIS — N200 Calculus of kidney: Secondary | ICD-10-CM

## 2019-04-01 DIAGNOSIS — M7981 Nontraumatic hematoma of soft tissue: Secondary | ICD-10-CM | POA: Diagnosis present

## 2019-04-01 DIAGNOSIS — F319 Bipolar disorder, unspecified: Secondary | ICD-10-CM | POA: Insufficient documentation

## 2019-04-01 DIAGNOSIS — Z6836 Body mass index (BMI) 36.0-36.9, adult: Secondary | ICD-10-CM | POA: Insufficient documentation

## 2019-04-01 DIAGNOSIS — E669 Obesity, unspecified: Secondary | ICD-10-CM | POA: Insufficient documentation

## 2019-04-01 HISTORY — PX: INCISION AND DRAINAGE ABSCESS: SHX5864

## 2019-04-01 HISTORY — DX: Unspecified maternal hypertension, unspecified trimester: O16.9

## 2019-04-01 LAB — POCT PREGNANCY, URINE: Preg Test, Ur: NEGATIVE

## 2019-04-01 SURGERY — INCISION AND DRAINAGE, ABSCESS
Anesthesia: General | Site: Back

## 2019-04-01 MED ORDER — ROCURONIUM BROMIDE 10 MG/ML (PF) SYRINGE
PREFILLED_SYRINGE | INTRAVENOUS | Status: AC
  Filled 2019-04-01: qty 10

## 2019-04-01 MED ORDER — CHLORHEXIDINE GLUCONATE 4 % EX LIQD: 60.0000 mL | Freq: Once | CUTANEOUS | Status: DC

## 2019-04-01 MED ORDER — ONDANSETRON HCL 4 MG/2ML IJ SOLN
INTRAMUSCULAR | Status: DC | PRN
  Administered 2019-04-01: 4 mg via INTRAVENOUS

## 2019-04-01 MED ORDER — ACETAMINOPHEN 500 MG PO TABS
ORAL_TABLET | ORAL | Status: AC
  Filled 2019-04-01: qty 2

## 2019-04-01 MED ORDER — BUPIVACAINE-EPINEPHRINE (PF) 0.25% -1:200000 IJ SOLN
INTRAMUSCULAR | Status: DC | PRN
  Administered 2019-04-01: 10 mL

## 2019-04-01 MED ORDER — CELECOXIB 400 MG PO CAPS
400.0000 mg | ORAL_CAPSULE | Freq: Once | ORAL | Status: AC
  Administered 2019-04-01: 07:00:00 400 mg via ORAL

## 2019-04-01 MED ORDER — LACTATED RINGERS IV SOLN: INTRAVENOUS | Status: DC

## 2019-04-01 MED ORDER — PROMETHAZINE HCL 25 MG/ML IJ SOLN: 6.2500 mg | INTRAMUSCULAR | Status: DC | PRN

## 2019-04-01 MED ORDER — FENTANYL CITRATE (PF) 100 MCG/2ML IJ SOLN
50.0000 ug | INTRAMUSCULAR | Status: DC | PRN
  Administered 2019-04-01: 07:00:00 100 ug via INTRAVENOUS

## 2019-04-01 MED ORDER — SUGAMMADEX SODIUM 200 MG/2ML IV SOLN
INTRAVENOUS | Status: DC | PRN
  Administered 2019-04-01: 200 mg via INTRAVENOUS

## 2019-04-01 MED ORDER — CELECOXIB 200 MG PO CAPS
ORAL_CAPSULE | ORAL | Status: AC
  Filled 2019-04-01: qty 2

## 2019-04-01 MED ORDER — LIDOCAINE HCL (CARDIAC) PF 100 MG/5ML IV SOSY
PREFILLED_SYRINGE | INTRAVENOUS | Status: DC | PRN
  Administered 2019-04-01: 80 mg via INTRAVENOUS

## 2019-04-01 MED ORDER — TRANEXAMIC ACID-NACL 1000-0.7 MG/100ML-% IV SOLN
INTRAVENOUS | Status: DC | PRN
  Administered 2019-04-01: 1000 mg via INTRAVENOUS

## 2019-04-01 MED ORDER — ROCURONIUM BROMIDE 100 MG/10ML IV SOLN
INTRAVENOUS | Status: DC | PRN
  Administered 2019-04-01: 60 mg via INTRAVENOUS

## 2019-04-01 MED ORDER — LIDOCAINE 2% (20 MG/ML) 5 ML SYRINGE
INTRAMUSCULAR | Status: AC
  Filled 2019-04-01: qty 5

## 2019-04-01 MED ORDER — PROPOFOL 10 MG/ML IV BOLUS
INTRAVENOUS | Status: DC | PRN
  Administered 2019-04-01: 30 mg via INTRAVENOUS
  Administered 2019-04-01: 180 mg via INTRAVENOUS
  Administered 2019-04-01: 20 mg via INTRAVENOUS

## 2019-04-01 MED ORDER — FENTANYL CITRATE (PF) 100 MCG/2ML IJ SOLN
INTRAMUSCULAR | Status: AC
  Filled 2019-04-01: qty 2

## 2019-04-01 MED ORDER — GABAPENTIN 300 MG PO CAPS
300.0000 mg | ORAL_CAPSULE | Freq: Once | ORAL | Status: AC
  Administered 2019-04-01: 07:00:00 300 mg via ORAL

## 2019-04-01 MED ORDER — HYDROCODONE-ACETAMINOPHEN 5-325 MG PO TABS: 1.0000 | ORAL_TABLET | ORAL | 0 refills | Status: DC | PRN

## 2019-04-01 MED ORDER — DEXAMETHASONE SODIUM PHOSPHATE 10 MG/ML IJ SOLN
INTRAMUSCULAR | Status: AC
  Filled 2019-04-01: qty 1

## 2019-04-01 MED ORDER — ONDANSETRON HCL 4 MG PO TABS: 4.0000 mg | ORAL_TABLET | Freq: Three times a day (TID) | ORAL | 0 refills | Status: DC | PRN

## 2019-04-01 MED ORDER — LACTATED RINGERS IV SOLN
INTRAVENOUS | Status: DC
  Administered 2019-04-01 (×2): via INTRAVENOUS

## 2019-04-01 MED ORDER — GABAPENTIN 300 MG PO CAPS
ORAL_CAPSULE | ORAL | Status: AC
  Filled 2019-04-01: qty 1

## 2019-04-01 MED ORDER — DEXAMETHASONE SODIUM PHOSPHATE 4 MG/ML IJ SOLN
INTRAMUSCULAR | Status: DC | PRN
  Administered 2019-04-01: 5 mg via INTRAVENOUS

## 2019-04-01 MED ORDER — FENTANYL CITRATE (PF) 100 MCG/2ML IJ SOLN
25.0000 ug | INTRAMUSCULAR | Status: DC | PRN
  Administered 2019-04-01: 09:00:00 50 ug via INTRAVENOUS
  Administered 2019-04-01: 25 ug via INTRAVENOUS

## 2019-04-01 MED ORDER — OXYCODONE HCL 5 MG/5ML PO SOLN: 5.0000 mg | Freq: Once | ORAL | Status: DC | PRN

## 2019-04-01 MED ORDER — MIDAZOLAM HCL 2 MG/2ML IJ SOLN
INTRAMUSCULAR | Status: AC
  Filled 2019-04-01: qty 2

## 2019-04-01 MED ORDER — CEFAZOLIN SODIUM-DEXTROSE 2-4 GM/100ML-% IV SOLN
INTRAVENOUS | Status: AC
  Filled 2019-04-01: qty 100

## 2019-04-01 MED ORDER — MIDAZOLAM HCL 2 MG/2ML IJ SOLN
1.0000 mg | INTRAMUSCULAR | Status: DC | PRN
  Administered 2019-04-01: 07:00:00 2 mg via INTRAVENOUS

## 2019-04-01 MED ORDER — BUPIVACAINE-EPINEPHRINE (PF) 0.25% -1:200000 IJ SOLN
INTRAMUSCULAR | Status: AC
  Filled 2019-04-01: qty 30

## 2019-04-01 MED ORDER — CEFAZOLIN SODIUM-DEXTROSE 2-4 GM/100ML-% IV SOLN
2.0000 g | INTRAVENOUS | Status: AC
  Administered 2019-04-01: 2 g via INTRAVENOUS

## 2019-04-01 MED ORDER — ACETAMINOPHEN 500 MG PO TABS: 1000.0000 mg | ORAL_TABLET | Freq: Once | ORAL | Status: DC

## 2019-04-01 MED ORDER — ONDANSETRON HCL 4 MG/2ML IJ SOLN
INTRAMUSCULAR | Status: AC
  Filled 2019-04-01: qty 2

## 2019-04-01 MED ORDER — ACETAMINOPHEN 500 MG PO TABS
1000.0000 mg | ORAL_TABLET | Freq: Once | ORAL | Status: AC
  Administered 2019-04-01: 07:00:00 1000 mg via ORAL

## 2019-04-01 MED ORDER — OXYCODONE HCL 5 MG PO TABS: 5.0000 mg | ORAL_TABLET | Freq: Once | ORAL | Status: DC | PRN

## 2019-04-01 MED ORDER — SCOPOLAMINE 1 MG/3DAYS TD PT72
1.0000 | MEDICATED_PATCH | Freq: Once | TRANSDERMAL | Status: DC
  Administered 2019-04-01: 07:00:00 1.5 mg via TRANSDERMAL

## 2019-04-01 MED ORDER — SCOPOLAMINE 1 MG/3DAYS TD PT72
MEDICATED_PATCH | TRANSDERMAL | Status: AC
  Filled 2019-04-01: qty 1

## 2019-04-01 MED ORDER — PROPOFOL 10 MG/ML IV BOLUS
INTRAVENOUS | Status: AC
  Filled 2019-04-01: qty 20

## 2019-04-01 SURGICAL SUPPLY — 52 items
APL PRP STRL LF DISP 70% ISPRP (MISCELLANEOUS) ×1
BINDER ABDOMINAL 12 SM 30-45 (SOFTGOODS) ×2 IMPLANT
BLADE HEX COATED 2.75 (ELECTRODE) ×2 IMPLANT
BLADE SURG 15 STRL LF DISP TIS (BLADE) ×2 IMPLANT
BLADE SURG 15 STRL SS (BLADE) ×6
CHLORAPREP W/TINT 26 (MISCELLANEOUS) ×3 IMPLANT
CLOSURE STERI-STRIP 1/2X4 (GAUZE/BANDAGES/DRESSINGS) ×1
CLSR STERI-STRIP ANTIMIC 1/2X4 (GAUZE/BANDAGES/DRESSINGS) ×2 IMPLANT
COVER BACK TABLE REUSABLE LG (DRAPES) ×3 IMPLANT
COVER MAYO STAND REUSABLE (DRAPES) ×3 IMPLANT
COVER WAND RF STERILE (DRAPES) IMPLANT
DECANTER SPIKE VIAL GLASS SM (MISCELLANEOUS) IMPLANT
DRAPE INCISE IOBAN 66X45 STRL (DRAPES) ×2 IMPLANT
DRAPE LAPAROTOMY T 98X78 PEDS (DRAPES) ×3 IMPLANT
DRSG EMULSION OIL 3X3 NADH (GAUZE/BANDAGES/DRESSINGS) ×3 IMPLANT
ELECT REM PT RETURN 9FT ADLT (ELECTROSURGICAL) ×3
ELECTRODE REM PT RTRN 9FT ADLT (ELECTROSURGICAL) ×1 IMPLANT
GAUZE SPONGE 4X4 12PLY STRL (GAUZE/BANDAGES/DRESSINGS) ×3 IMPLANT
GLOVE BIO SURGEON STRL SZ7.5 (GLOVE) ×6 IMPLANT
GLOVE BIOGEL PI IND STRL 7.0 (GLOVE) IMPLANT
GLOVE BIOGEL PI IND STRL 8 (GLOVE) ×2 IMPLANT
GLOVE BIOGEL PI INDICATOR 7.0 (GLOVE) ×4
GLOVE BIOGEL PI INDICATOR 8 (GLOVE) ×4
GLOVE ECLIPSE 6.5 STRL STRAW (GLOVE) ×2 IMPLANT
GOWN STRL REUS W/ TWL LRG LVL3 (GOWN DISPOSABLE) ×2 IMPLANT
GOWN STRL REUS W/ TWL XL LVL3 (GOWN DISPOSABLE) ×1 IMPLANT
GOWN STRL REUS W/TWL LRG LVL3 (GOWN DISPOSABLE) ×6
GOWN STRL REUS W/TWL XL LVL3 (GOWN DISPOSABLE) ×3
MANIFOLD NEPTUNE II (INSTRUMENTS) ×2 IMPLANT
NEEDLE HYPO 22GX1.5 SAFETY (NEEDLE) IMPLANT
NS IRRIG 1000ML POUR BTL (IV SOLUTION) ×3 IMPLANT
PACK BASIN DAY SURGERY FS (CUSTOM PROCEDURE TRAY) ×3 IMPLANT
PENCIL BUTTON HOLSTER BLD 10FT (ELECTRODE) ×3 IMPLANT
SLEEVE SCD COMPRESS KNEE MED (MISCELLANEOUS) ×2 IMPLANT
SPONGE LAP 18X18 RF (DISPOSABLE) ×2 IMPLANT
SPONGE LAP 4X18 RFD (DISPOSABLE) ×3 IMPLANT
SUCTION FRAZIER HANDLE 10FR (MISCELLANEOUS)
SUCTION TUBE FRAZIER 10FR DISP (MISCELLANEOUS) IMPLANT
SUT MNCRL AB 4-0 PS2 18 (SUTURE) ×2 IMPLANT
SUT MON AB 2-0 CT1 36 (SUTURE) ×3 IMPLANT
SUT MON AB 4-0 PC3 18 (SUTURE) ×1 IMPLANT
SUT VIC AB 0 SH 27 (SUTURE) IMPLANT
SUT VIC AB 2-0 SH 27 (SUTURE)
SUT VIC AB 2-0 SH 27XBRD (SUTURE) IMPLANT
SYR BULB 3OZ (MISCELLANEOUS) ×1 IMPLANT
SYR BULB IRRIGATION 50ML (SYRINGE) ×2 IMPLANT
SYR CONTROL 10ML LL (SYRINGE) IMPLANT
TOWEL GREEN STERILE FF (TOWEL DISPOSABLE) ×6 IMPLANT
TUBE CONNECTING 20'X1/4 (TUBING) ×1
TUBE CONNECTING 20X1/4 (TUBING) ×2 IMPLANT
UNDERPAD 30X36 HEAVY ABSORB (UNDERPADS AND DIAPERS) ×3 IMPLANT
YANKAUER SUCT BULB TIP NO VENT (SUCTIONS) ×3 IMPLANT

## 2019-04-01 NOTE — Anesthesia Postprocedure Evaluation (Signed)
Anesthesia Post Note  Patient: [name] [name]  Procedure(s) Performed: OPEN INCISION AND DRAINAGE OF DEEP POSTERIOR SPINE LUMBOSACRAL HEMATOMA (N/A Back)     Patient location during evaluation: PACU Anesthesia Type: General Level of consciousness: awake and alert and oriented Pain management: pain level controlled Vital Signs Assessment: post-procedure vital signs reviewed and stable Respiratory status: spontaneous breathing, nonlabored ventilation and respiratory function stable Cardiovascular status: blood pressure returned to baseline Postop Assessment: no apparent nausea or vomiting Anesthetic complications: no    Last Vitals:  Vitals:   04/01/19 0915 04/01/19 0930  BP: 101/67 112/74  Pulse: 69 (!) 59  Resp: 18 14  Temp:    SpO2: 100% 95%    Last Pain:  Vitals:   04/01/19 0930  TempSrc:   PainSc: [place]

## 2019-04-01 NOTE — Transfer of Care (Signed)
Immediate Anesthesia Transfer of Care Note  Patient: [name] [name]  Procedure(s) Performed: OPEN INCISION AND DRAINAGE OF DEEP POSTERIOR SPINE LUMBOSACRAL HEMATOMA (N/A Back)  Patient Location: PACU  Anesthesia Type:General  Level of Consciousness: awake, alert  and oriented  Airway & Oxygen Therapy: Patient Spontanous Breathing and Patient connected to nasal cannula oxygen  Post-op Assessment: Report given to RN and Post -op Vital signs reviewed and stable  Post vital signs: Reviewed and stable  Last Vitals:  Vitals Value Taken Time  BP    Temp    Pulse 101 04/01/19 0820  Resp 17 04/01/19 0820  SpO2 100 % 04/01/19 0820  Vitals shown include unvalidated device data.  Last Pain:  Vitals:   04/01/19 0646  TempSrc: Oral  PainSc: 8       Patients Stated Pain Goal: 5 (43/15/40 0867)  Complications: No apparent anesthesia complications

## 2019-04-01 NOTE — Anesthesia Procedure Notes (Signed)
Procedure Name: Intubation Date/Time: 04/01/2019 7:46 AM Performed by: Bufford Spikes, CRNA Pre-anesthesia Checklist: Patient identified, Emergency Drugs available, Suction available and Patient being monitored Patient Re-evaluated:Patient Re-evaluated prior to induction Oxygen Delivery Method: Circle system utilized Preoxygenation: Pre-oxygenation with 100% oxygen Induction Type: IV induction Ventilation: Mask ventilation without difficulty Laryngoscope Size: Miller and 2 Grade View: Grade II Tube type: Oral Tube size: 7.0 mm Number of attempts: 1 Airway Equipment and Method: Stylet and Oral airway Placement Confirmation: ETT inserted through vocal cords under direct vision,  positive ETCO2 and breath sounds checked- equal and bilateral Secured at: 22 cm Tube secured with: Tape Dental Injury: Teeth and Oropharynx as per pre-operative assessment

## 2019-04-01 NOTE — Interval H&P Note (Signed)
I participated in the care of this patient and agree with the above history, physical and evaluation. I performed a review of the history and a physical exam as detailed   Righteous [employer]orne Daniel Emaad Nanna MD  

## 2019-04-01 NOTE — Op Note (Signed)
04/01/2019  8:28 AM  PATIENT:  [name] [name]    PRE-OPERATIVE DIAGNOSIS:  NONTRAUMATIC HEMATOMA OF SOFT TISSUE M79.81  POST-OPERATIVE DIAGNOSIS:  Same  PROCEDURE:  OPEN INCISION AND DRAINAGE OF DEEP POSTERIOR SPINE LUMBOSACRAL HEMATOMA  SURGEON:  Renette Butters, MD  ASSISTANT: none  ANESTHESIA:   gen  PREOPERATIVE INDICATIONS:  [name] [name] is a  28 y.o. female with a diagnosis of NONTRAUMATIC HEMATOMA OF SOFT TISSUE M79.81 who failed conservative measures and elected for surgical management.    The risks benefits and alternatives were discussed with the patient preoperatively including but not limited to the risks of infection, bleeding, nerve injury, cardiopulmonary complications, the need for revision surgery, among others, and the patient was willing to proceed.  OPERATIVE IMPLANTS: none  OPERATIVE FINDINGS: hematoma  BLOOD LOSS: min  COMPLICATIONS: none  TOURNIQUET TIME: none  OPERATIVE PROCEDURE:  Patient was identified in the preoperative holding area and site was marked by me She was transported to the operating theater and placed on the table in supine position taking care to pad all bony prominences. After a preincinduction time out anesthesia was induced. The low back was prepped and draped in normal sterile fashion and a pre-incision timeout was performed. She received ancef for preoperative antibiotics.   She was given TXA to help prevent re-formation of the hematoma.  She was positioned supine care was taken to pad all bony prominences.  A transverse incision was made directly over hematoma.  I was able to probe into the deep space he had a large amount of hematoma and some clotted blood.  This was evacuated completely I then thoroughly irrigated the space.  After evacuating this fluid collection from her lower lumbar sacral region I then closed her incision with a Monocryl stitch and prepped placed a compressive dressing with a abdominal binder.  She  was awoken taken to PACU in stable condition  POST OPERATIVE PLAN: Mobilize for DVT prophylaxis weightbearing as tolerated bilateral lower extremities

## 2019-04-01 NOTE — Anesthesia Preprocedure Evaluation (Addendum)
Anesthesia Evaluation  Patient identified by MRN, date of birth, ID band Patient awake    Reviewed: Allergy & Precautions, NPO status , Patient's Chart, lab work & pertinent test results  History of Anesthesia Complications Negative for: history of anesthetic complications  Airway Mallampati: II  TM Distance: >3 FB Neck ROM: Full    Dental  (+) Chipped,    Pulmonary Patient abstained from smoking., former smoker,    Pulmonary exam normal        Cardiovascular hypertension, Normal cardiovascular exam     Neuro/Psych Anxiety Depression Bipolar Disorder Schizophrenia negative neurological ROS  negative psych ROS   GI/Hepatic negative GI ROS, Neg liver ROS,   Endo/Other  Obese, BMI 37  Renal/GU negative Renal ROS  negative genitourinary   Musculoskeletal negative musculoskeletal ROS (+)   Abdominal   Peds  Hematology negative hematology ROS (+)   Anesthesia Other Findings Day of surgery medications reviewed with patient.  Reproductive/Obstetrics negative OB ROS                            Anesthesia Physical Anesthesia Plan  ASA: II  Anesthesia Plan: General   Post-op Pain Management:    Induction: Intravenous  PONV Risk Score and Plan: 4 or greater and Treatment may vary due to age or medical condition, Ondansetron, Dexamethasone, Midazolam and Scopolamine patch - Pre-op  Airway Management Planned: Oral ETT  Additional Equipment:   Intra-op Plan:   Post-operative Plan: Extubation in OR  Informed Consent: I have reviewed the patients History and Physical, chart, labs and discussed the procedure including the risks, benefits and alternatives for the proposed anesthesia with the patient or authorized representative who has indicated his/her understanding and acceptance.     Dental advisory given  Plan Discussed with: CRNA  Anesthesia Plan Comments:        Anesthesia  Quick Evaluation

## 2019-04-01 NOTE — Discharge Instructions (Signed)
Keep dressing C/D/I with binder on for 1wk then may replace with bandaid and shower    Able to start taking Tylenol and Ibuprofen at 1:00 PM on 04/01/2019 if needed.     Post Anesthesia Home Care Instructions  Activity: Get plenty of rest for the remainder of the day. A responsible individual must stay with you for 24 hours following the procedure.  For the next 24 hours, DO NOT: -Drive a car -Paediatric nurse -Drink alcoholic beverages -Take any medication unless instructed by your physician -Make any legal decisions or sign important papers.  Meals: Start with liquid foods such as gelatin or soup. Progress to regular foods as tolerated. Avoid greasy, spicy, heavy foods. If nausea and/or vomiting occur, drink only clear liquids until the nausea and/or vomiting subsides. Call your physician if vomiting continues.  Special Instructions/Symptoms: Your throat may feel dry or sore from the anesthesia or the breathing tube placed in your throat during surgery. If this causes discomfort, gargle with warm salt water. The discomfort should disappear within 24 hours.  If you had a scopolamine patch placed behind your ear for the management of post- operative nausea and/or vomiting:  1. The medication in the patch is effective for 72 hours, after which it should be removed.  Wrap patch in a tissue and discard in the trash. Wash hands thoroughly with soap and water. 2. You may remove the patch earlier than 72 hours if you experience unpleasant side effects which may include dry mouth, dizziness or visual disturbances. 3. Avoid touching the patch. Wash your hands with soap and water after contact with the patch.    Call your surgeon if you experience:   1.  Fever over 101.0. 2.  Inability to urinate. 3.  Nausea and/or vomiting. 4.  Extreme swelling or bruising at the surgical site. 5.  Continued bleeding from the incision. 6.  Increased pain, redness or drainage from the incision. 7.   Problems related to your pain medication. 8.  Any problems and/or concerns

## 2019-04-03 ENCOUNTER — Encounter (HOSPITAL_BASED_OUTPATIENT_CLINIC_OR_DEPARTMENT_OTHER): Payer: Self-pay | Admitting: Orthopedic Surgery

## 2019-05-15 ENCOUNTER — Institutional Professional Consult (permissible substitution): Payer: Medicaid Other | Admitting: Plastic Surgery

## 2019-08-28 ENCOUNTER — Ambulatory Visit: Payer: Medicaid Other | Attending: Urology | Admitting: Physical Therapy

## 2019-09-18 ENCOUNTER — Ambulatory Visit: Payer: Medicaid Other | Admitting: Physical Therapy

## 2019-10-30 NOTE — Progress Notes (Deleted)
Psychiatric Initial Adult Assessment   Patient Identification: [name] [name] MRN:  [id number] Date of Evaluation:  10/30/2019 Referral Source: *** Chief Complaint:   Visit Diagnosis: No diagnosis found.  History of Present Illness:   [name] [name] is a 29 y.o. year old female with a history of , who is referred for   Schizoaffective disorder?   Associated Signs/Symptoms: Depression Symptoms:  {DEPRESSION SYMPTOMS:20000} (Hypo) Manic Symptoms:  {BHH MANIC SYMPTOMS:22872} Anxiety Symptoms:  {BHH ANXIETY SYMPTOMS:22873} Psychotic Symptoms:  {BHH PSYCHOTIC SYMPTOMS:22874} PTSD Symptoms: {BHH PTSD SYMPTOMS:22875}  Past Psychiatric History:  Outpatient:  Psychiatry admission:  Previous suicide attempt:  Past trials of medication:  History of violence:   Previous Psychotropic Medications: {YES/NO:21197}  Substance Abuse History in the last 12 months:  {yes no:314532}  Consequences of Substance Abuse: {BHH CONSEQUENCES OF SUBSTANCE ABUSE:22880}  Past Medical History:  Past Medical History:  Diagnosis Date  . ADHD (attention deficit hyperactivity disorder)   . ADHD (attention deficit hyperactivity disorder)   . Anxiety   . Bipolar 1 disorder (HCC)   . Bipolar 1 disorder (HCC)   . Depression   . Hypertension affecting pregnancy    HX OF HTN; NO MEDS  . IBS (irritable bowel syndrome)   . Kidney stone   . Left ureteral calculus    lithrotripsy  . Renal disorder   . Schizophrenia (HCC)    MILD  . Schizophrenia [facility])     Past Surgical History:  Procedure Laterality Date  . CYSTOSCOPY/RETROGRADE/URETEROSCOPY  08/25/2011   Procedure: CYSTOSCOPY/RETROGRADE/URETEROSCOPY;  Surgeon: Antony Haste, MD;  Location: [facility];  Service: Urology;  Laterality: Left;  cystoscopy LEFT retrograde pyelogram LEFT URETEROSCOPY holmium  LASER LITHO AND BASKET EXTRACTION C-ARM LASER  . DILATION AND EVACUATION  12/29/2011   Procedure: DILATATION AND  EVACUATION;  Surgeon: Purcell Nails, MD;  Location: [facility];  Service: Gynecology;  Laterality: N/A;  . INCISION AND DRAINAGE ABSCESS N/A 04/01/2019   Procedure: OPEN INCISION AND DRAINAGE OF DEEP POSTERIOR SPINE LUMBOSACRAL HEMATOMA;  Surgeon: Sheral Apley, MD;  Location: [facility];  Service: Orthopedics;  Laterality: N/A;  . KIDNEY STONE SURGERY  07/2011   9 MM  . WISDOM TOOTH EXTRACTION  2009- oral md office    Family Psychiatric History: ***  Family History:  Family History  Problem Relation Age of Onset  . Heart disease Paternal Grandfather   . Heart disease Maternal Grandmother   . Hypertension Maternal Grandmother   . Other Mother        VARICOSE VEINS  . Anxiety disorder Mother   . Diabetes Maternal Grandfather     Social History:   Social History   Socioeconomic History  . Marital status: Single    Spouse name: Not on file  . Number of children: 1  . Years of education: 31  . Highest education level: Not on file  Occupational History  . Not on file  Tobacco Use  . Smoking status: Former Smoker    Packs/day: 0.25    Types: Cigarettes    Quit date: 11/12/2011    Years since quitting: 7.9  . Smokeless tobacco: Never Used  Substance and Sexual Activity  . Alcohol use: Not Currently  . Drug use: No  . Sexual activity: Yes    Partners: Male    Birth control/protection: None  Other Topics Concern  . Not on file  Social History Narrative   RAPED @ 23 YOA ON HER BIRTHDAY   Social Determinants  of Health   Financial Resource Strain:   . Difficulty of Paying Living Expenses:   Food Insecurity:   . Worried About Charity fundraiser in the Last Year:   . Arboriculturist in the Last Year:   Transportation Needs:   . Film/video editor (Medical):   Marland Kitchen Lack of Transportation (Non-Medical):   Physical Activity:   . Days of Exercise per Week:   . Minutes of Exercise per Session:   Stress:   . Feeling of Stress :   Social Connections:   .  Frequency of Communication with Friends and Family:   . Frequency of Social Gatherings with Friends and Family:   . Attends Religious Services:   . Active Member of Clubs or Organizations:   . Attends Archivist Meetings:   Marland Kitchen Marital Status:     Additional Social History: ***  Allergies:  No Known Allergies  Metabolic Disorder Labs: No results found for: HGBA1C, MPG No results found for: PROLACTIN No results found for: CHOL, TRIG, HDL, CHOLHDL, VLDL, LDLCALC No results found for: TSH  Therapeutic Level Labs: No results found for: LITHIUM No results found for: CBMZ No results found for: VALPROATE  Current Medications: Current Outpatient Medications  Medication Sig Dispense Refill  . HYDROcodone-acetaminophen (NORCO) 5-325 MG tablet Take 1-2 tablets by mouth every 4 (four) hours as needed. 10 tablet 0  . ondansetron (ZOFRAN) 4 MG tablet Take 1 tablet (4 mg total) by mouth every 8 (eight) hours as needed for nausea. 10 tablet 0   No current facility-administered medications for this visit.    Musculoskeletal: Strength & Muscle Tone: N/A Gait & Station: N/A Patient leans: N/A  Psychiatric Specialty Exam: Review of Systems  There were no vitals taken for this visit.There is no height or weight on file to calculate BMI.  General Appearance: {Appearance:22683}  Eye Contact:  {BHH EYE CONTACT:22684}  Speech:  Clear and Coherent  Volume:  Normal  Mood:  {BHH MOOD:22306}  Affect:  {Affect (PAA):22687}  Thought Process:  Coherent  Orientation:  Full (Time, Place, and Person)  Thought Content:  Logical  Suicidal Thoughts:  {ST/HT (PAA):22692}  Homicidal Thoughts:  {ST/HT (PAA):22692}  Memory:  Immediate;   Good  Judgement:  {Judgement (PAA):22694}  Insight:  {Insight (PAA):22695}  Psychomotor Activity:  Normal  Concentration:  Concentration: Good and Attention Span: Good  Recall:  Good  Fund of Knowledge:Good  Language: Good  Akathisia:  No  Handed:  Right   AIMS (if indicated):  not done  Assets:  Communication Skills Desire for Improvement  ADL's:  Intact  Cognition: WNL  Sleep:  {BHH GOOD/FAIR/POOR:22877}   Screenings:   Assessment and Plan:  Assessment  Plan  The patient demonstrates the following risk factors for suicide: Chronic risk factors for suicide include: {Chronic Risk Factors for DGLOVFI:43329518}. Acute risk factors for suicide include: {Acute Risk Factors for ACZYSAY:30160109}. Protective factors for this patient include: {Protective Factors for Suicide NATF:57322025}. Considering these factors, the overall suicide risk at this point appears to be {Desc; low/moderate/high:110033}. Patient {ACTION; IS/IS KYH:06237628} appropriate for outpatient follow up.   Norman Clay, MD 4/15/20211:50 PM

## 2019-11-06 ENCOUNTER — Other Ambulatory Visit: Payer: Self-pay

## 2019-11-06 ENCOUNTER — Telehealth (HOSPITAL_COMMUNITY): Payer: Medicaid Other | Admitting: Psychiatry

## 2019-11-06 ENCOUNTER — Telehealth (HOSPITAL_COMMUNITY): Payer: Self-pay | Admitting: Psychiatry

## 2019-11-06 NOTE — Telephone Encounter (Signed)
Sent link for video visit through Epic. Patient did not sign in. Called the patient  for appointment scheduled today. The patient did not answer the phone. Left voice message to contact the office.  

## 2020-01-28 NOTE — Progress Notes (Deleted)
Psychiatric Initial Adult Assessment   Patient Identification: [name] [name] MRN:  [id number] Date of Evaluation:  01/28/2020 Referral Source: *** Chief Complaint:   Visit Diagnosis: No diagnosis found.  History of Present Illness:   [name] [name] is a 29 y.o. year old female with a history of , who is referred for   Schizoaffective disorder?     Associated Signs/Symptoms: Depression Symptoms:  {DEPRESSION SYMPTOMS:20000} (Hypo) Manic Symptoms:  {BHH MANIC SYMPTOMS:22872} Anxiety Symptoms:  {BHH ANXIETY SYMPTOMS:22873} Psychotic Symptoms:  {BHH PSYCHOTIC SYMPTOMS:22874} PTSD Symptoms: {BHH PTSD SYMPTOMS:22875}  Past Psychiatric History:  Outpatient:  Psychiatry admission:  Previous suicide attempt:  Past trials of medication:  History of violence:   Previous Psychotropic Medications: {YES/NO:21197}  Substance Abuse History in the last 12 months:  {yes no:314532}  Consequences of Substance Abuse: {BHH CONSEQUENCES OF SUBSTANCE ABUSE:22880}  Past Medical History:  Past Medical History:  Diagnosis Date  . ADHD (attention deficit hyperactivity disorder)   . ADHD (attention deficit hyperactivity disorder)   . Anxiety   . Bipolar 1 disorder (HCC)   . Bipolar 1 disorder (HCC)   . Depression   . Hypertension affecting pregnancy    HX OF HTN; NO MEDS  . IBS (irritable bowel syndrome)   . Kidney stone   . Left ureteral calculus    lithrotripsy  . Renal disorder   . Schizophrenia (HCC)    MILD  . Schizophrenia [facility])     Past Surgical History:  Procedure Laterality Date  . CYSTOSCOPY/RETROGRADE/URETEROSCOPY  08/25/2011   Procedure: CYSTOSCOPY/RETROGRADE/URETEROSCOPY;  Surgeon: Antony Haste, MD;  Location: [facility];  Service: Urology;  Laterality: Left;  cystoscopy LEFT retrograde pyelogram LEFT URETEROSCOPY holmium  LASER LITHO AND BASKET EXTRACTION C-ARM LASER  . DILATION AND EVACUATION  12/29/2011   Procedure: DILATATION AND  EVACUATION;  Surgeon: Purcell Nails, MD;  Location: [facility];  Service: Gynecology;  Laterality: N/A;  . INCISION AND DRAINAGE ABSCESS N/A 04/01/2019   Procedure: OPEN INCISION AND DRAINAGE OF DEEP POSTERIOR SPINE LUMBOSACRAL HEMATOMA;  Surgeon: Sheral Apley, MD;  Location: [facility];  Service: Orthopedics;  Laterality: N/A;  . KIDNEY STONE SURGERY  07/2011   9 MM  . WISDOM TOOTH EXTRACTION  2009- oral md office    Family Psychiatric History: ***  Family History:  Family History  Problem Relation Age of Onset  . Heart disease Paternal Grandfather   . Heart disease Maternal Grandmother   . Hypertension Maternal Grandmother   . Other Mother        VARICOSE VEINS  . Anxiety disorder Mother   . Diabetes Maternal Grandfather     Social History:   Social History   Socioeconomic History  . Marital status: Single    Spouse name: Not on file  . Number of children: 1  . Years of education: 23  . Highest education level: Not on file  Occupational History  . Not on file  Tobacco Use  . Smoking status: Former Smoker    Packs/day: 0.25    Types: Cigarettes    Quit date: 11/12/2011    Years since quitting: 8.2  . Smokeless tobacco: Never Used  Vaping Use  . Vaping Use: Some days  . Substances: Nicotine  Substance and Sexual Activity  . Alcohol use: Not Currently  . Drug use: No  . Sexual activity: Yes    Partners: Male    Birth control/protection: None  Other Topics Concern  . Not on file  Social  History Narrative   RAPED @ 20 YOA ON HER BIRTHDAY   Social Determinants of Health   Financial Resource Strain:   . Difficulty of Paying Living Expenses:   Food Insecurity:   . Worried About Programme researcher, broadcasting/film/video in the Last Year:   . Barista in the Last Year:   Transportation Needs:   . Freight forwarder (Medical):   Marland Kitchen Lack of Transportation (Non-Medical):   Physical Activity:   . Days of Exercise per Week:   . Minutes of Exercise per Session:    Stress:   . Feeling of Stress :   Social Connections:   . Frequency of Communication with Friends and Family:   . Frequency of Social Gatherings with Friends and Family:   . Attends Religious Services:   . Active Member of Clubs or Organizations:   . Attends Banker Meetings:   Marland Kitchen Marital Status:     Additional Social History: ***  Allergies:  No Known Allergies  Metabolic Disorder Labs: No results found for: HGBA1C, MPG No results found for: PROLACTIN No results found for: CHOL, TRIG, HDL, CHOLHDL, VLDL, LDLCALC No results found for: TSH  Therapeutic Level Labs: No results found for: LITHIUM No results found for: CBMZ No results found for: VALPROATE  Current Medications: Current Outpatient Medications  Medication Sig Dispense Refill  . HYDROcodone-acetaminophen (NORCO) 5-325 MG tablet Take 1-2 tablets by mouth every 4 (four) hours as needed. 10 tablet 0  . ondansetron (ZOFRAN) 4 MG tablet Take 1 tablet (4 mg total) by mouth every 8 (eight) hours as needed for nausea. 10 tablet 0   No current facility-administered medications for this visit.    Musculoskeletal: Strength & Muscle Tone: N/A Gait & Station: N/A Patient leans: N/A  Psychiatric Specialty Exam: Review of Systems  There were no vitals taken for this visit.There is no height or weight on file to calculate BMI.  General Appearance: {Appearance:22683}  Eye Contact:  {BHH EYE CONTACT:22684}  Speech:  Clear and Coherent  Volume:  Normal  Mood:  {BHH MOOD:22306}  Affect:  {Affect (PAA):22687}  Thought Process:  Coherent  Orientation:  Full (Time, Place, and Person)  Thought Content:  Logical  Suicidal Thoughts:  {ST/HT (PAA):22692}  Homicidal Thoughts:  {ST/HT (PAA):22692}  Memory:  Immediate;   Good  Judgement:  {Judgement (PAA):22694}  Insight:  {Insight (PAA):22695}  Psychomotor Activity:  Normal  Concentration:  Concentration: Good and Attention Span: Good  Recall:  Good  Fund of  Knowledge:Good  Language: Good  Akathisia:  No  Handed:  Right  AIMS (if indicated):  not done  Assets:  Communication Skills Desire for Improvement  ADL's:  Intact  Cognition: WNL  Sleep:  {BHH GOOD/FAIR/POOR:22877}   Screenings:   Assessment and Plan:  Assessment  Plan  The patient demonstrates the following risk factors for suicide: Chronic risk factors for suicide include: {Chronic Risk Factors for TKZSWFU:93235573}. Acute risk factors for suicide include: {Acute Risk Factors for UKGURKY:70623762}. Protective factors for this patient include: {Protective Factors for Suicide GBTD:17616073}. Considering these factors, the overall suicide risk at this point appears to be {Desc; low/moderate/high:110033}. Patient {ACTION; IS/IS XTG:62694854} appropriate for outpatient follow up.    Neysa Hotter, MD 7/14/20213:10 PM

## 2020-01-29 ENCOUNTER — Telehealth (HOSPITAL_COMMUNITY): Payer: Medicaid Other | Admitting: Psychiatry

## 2020-02-20 NOTE — Progress Notes (Deleted)
Psychiatric Initial Adult Assessment   Patient Identification: [name] [name] MRN:  [id number] Date of Evaluation:  02/20/2020 Referral Source: *** Chief Complaint:   Visit Diagnosis: No diagnosis found.  History of Present Illness:   [name] [name] is a 29 y.o. year old female with a history of schizoaffective disorder, who is referred for         Associated Signs/Symptoms: Depression Symptoms:  {DEPRESSION SYMPTOMS:20000} (Hypo) Manic Symptoms:  {BHH MANIC SYMPTOMS:22872} Anxiety Symptoms:  {BHH ANXIETY SYMPTOMS:22873} Psychotic Symptoms:  {BHH PSYCHOTIC SYMPTOMS:22874} PTSD Symptoms: {BHH PTSD SYMPTOMS:22875}  Past Psychiatric History:  Outpatient:  Psychiatry admission:  Previous suicide attempt:  Past trials of medication:  History of violence:   Previous Psychotropic Medications: {YES/NO:21197}  Substance Abuse History in the last 12 months:  {yes no:314532}  Consequences of Substance Abuse: {BHH CONSEQUENCES OF SUBSTANCE ABUSE:22880}  Past Medical History:  Past Medical History:  Diagnosis Date  . ADHD (attention deficit hyperactivity disorder)   . ADHD (attention deficit hyperactivity disorder)   . Anxiety   . Bipolar 1 disorder (HCC)   . Bipolar 1 disorder (HCC)   . Depression   . Hypertension affecting pregnancy    HX OF HTN; NO MEDS  . IBS (irritable bowel syndrome)   . Kidney stone   . Left ureteral calculus    lithrotripsy  . Renal disorder   . Schizophrenia (HCC)    MILD  . Schizophrenia [facility])     Past Surgical History:  Procedure Laterality Date  . CYSTOSCOPY/RETROGRADE/URETEROSCOPY  08/25/2011   Procedure: CYSTOSCOPY/RETROGRADE/URETEROSCOPY;  Surgeon: Antony Haste, MD;  Location[facility];  Service: Urology;  Laterality: Left;  cystoscopy LEFT retrograde pyelogram LEFT URETEROSCOPY holmium  LASER LITHO AND BASKET EXTRACTION C-ARM LASER  . DILATION AND EVACUATION  12/29/2011   Procedure: DILATATION AND  EVACUATION;  Surgeon: Purcell Nails, MD;  Location: [facility];  Service: Gynecology;  Laterality: N/A;  . INCISION AND DRAINAGE ABSCESS N/A 04/01/2019   Procedure: OPEN INCISION AND DRAINAGE OF DEEP POSTERIOR SPINE LUMBOSACRAL HEMATOMA;  Surgeon: Sheral Apley, MD;  Location: [facility];  Service: Orthopedics;  Laterality: N/A;  . KIDNEY STONE SURGERY  07/2011   9 MM  . WISDOM TOOTH EXTRACTION  2009- oral md office    Family Psychiatric History: ***  Family History:  Family History  Problem Relation Age of Onset  . Heart disease Paternal Grandfather   . Heart disease Maternal Grandmother   . Hypertension Maternal Grandmother   . Other Mother        VARICOSE VEINS  . Anxiety disorder Mother   . Diabetes Maternal Grandfather     Social History:   Social History   Socioeconomic History  . Marital status: Single    Spouse name: Not on file  . Number of children: 1  . Years of education: 31  . Highest education level: Not on file  Occupational History  . Not on file  Tobacco Use  . Smoking status: Former Smoker    Packs/day: 0.25    Types: Cigarettes    Quit date: 11/12/2011    Years since quitting: 8.2  . Smokeless tobacco: Never Used  Vaping Use  . Vaping Use: Some days  . Substances: Nicotine  Substance and Sexual Activity  . Alcohol use: Not Currently  . Drug use: No  . Sexual activity: Yes    Partners: Male    Birth control/protection: None  Other Topics Concern  . Not on file  Social History Narrative   RAPED @ 16 YOA ON HER BIRTHDAY   Social Determinants of Health   Financial Resource Strain:   . Difficulty of Paying Living Expenses:   Food Insecurity:   . Worried About Programme researcher, broadcasting/film/video in the Last Year:   . Barista in the Last Year:   Transportation Needs:   . Freight forwarder (Medical):   Marland Kitchen Lack of Transportation (Non-Medical):   Physical Activity:   . Days of Exercise per Week:   . Minutes of Exercise per Session:    Stress:   . Feeling of Stress :   Social Connections:   . Frequency of Communication with Friends and Family:   . Frequency of Social Gatherings with Friends and Family:   . Attends Religious Services:   . Active Member of Clubs or Organizations:   . Attends Banker Meetings:   Marland Kitchen Marital Status:     Additional Social History: ***  Allergies:  No Known Allergies  Metabolic Disorder Labs: No results found for: HGBA1C, MPG No results found for: PROLACTIN No results found for: CHOL, TRIG, HDL, CHOLHDL, VLDL, LDLCALC No results found for: TSH  Therapeutic Level Labs: No results found for: LITHIUM No results found for: CBMZ No results found for: VALPROATE  Current Medications: Current Outpatient Medications  Medication Sig Dispense Refill  . HYDROcodone-acetaminophen (NORCO) 5-325 MG tablet Take 1-2 tablets by mouth every 4 (four) hours as needed. 10 tablet 0  . ondansetron (ZOFRAN) 4 MG tablet Take 1 tablet (4 mg total) by mouth every 8 (eight) hours as needed for nausea. 10 tablet 0   No current facility-administered medications for this visit.    Musculoskeletal: Strength & Muscle Tone: N/A Gait & Station: N/A Patient leans: N/A  Psychiatric Specialty Exam: Review of Systems  There were no vitals taken for this visit.There is no height or weight on file to calculate BMI.  General Appearance: {Appearance:22683}  Eye Contact:  {BHH EYE CONTACT:22684}  Speech:  Clear and Coherent  Volume:  Normal  Mood:  {BHH MOOD:22306}  Affect:  {Affect (PAA):22687}  Thought Process:  Coherent  Orientation:  Full (Time, Place, and Person)  Thought Content:  Logical  Suicidal Thoughts:  {ST/HT (PAA):22692}  Homicidal Thoughts:  {ST/HT (PAA):22692}  Memory:  Immediate;   Good  Judgement:  {Judgement (PAA):22694}  Insight:  {Insight (PAA):22695}  Psychomotor Activity:  Normal  Concentration:  Concentration: Good and Attention Span: Good  Recall:  Good  Fund of  Knowledge:Good  Language: Good  Akathisia:  No  Handed:  Right  AIMS (if indicated):  not done  Assets:  Communication Skills Desire for Improvement  ADL's:  Intact  Cognition: WNL  Sleep:  {BHH GOOD/FAIR/POOR:22877}   Screenings:   Assessment and Plan:  Assessment  Plan  The patient demonstrates the following risk factors for suicide: Chronic risk factors for suicide include: {Chronic Risk Factors for RXVQMGQ:67619509}. Acute risk factors for suicide include: {Acute Risk Factors for TOIZTIW:58099833}. Protective factors for this patient include: {Protective Factors for Suicide ASNK:53976734}. Considering these factors, the overall suicide risk at this point appears to be {Desc; low/moderate/high:110033}. Patient {ACTION; IS/IS LPF:79024097} appropriate for outpatient follow up.     Neysa Hotter, MD 8/6/20219:52 AM

## 2020-02-26 ENCOUNTER — Other Ambulatory Visit: Payer: Self-pay

## 2020-02-26 ENCOUNTER — Telehealth (HOSPITAL_COMMUNITY): Payer: Self-pay | Admitting: Psychiatry

## 2020-02-26 ENCOUNTER — Telehealth (HOSPITAL_COMMUNITY): Payer: Medicaid Other | Admitting: Psychiatry

## 2020-02-26 NOTE — Telephone Encounter (Signed)
Sent link for video visit through Epic. Patient did not sign in. Called the patient for appointment scheduled today. The patient did not answer the phone. No option to leave a voice message.  

## 2020-03-04 ENCOUNTER — Telehealth (HOSPITAL_COMMUNITY): Payer: Self-pay | Admitting: Psychiatry

## 2020-03-04 NOTE — Telephone Encounter (Signed)
Called patient to advised referral is declined due to 2 noshow appointments for new patient appointments. Unable to leave messages.

## 2020-03-22 ENCOUNTER — Encounter (HOSPITAL_COMMUNITY): Payer: Self-pay | Admitting: Emergency Medicine

## 2020-03-22 ENCOUNTER — Other Ambulatory Visit: Payer: Self-pay

## 2020-03-22 ENCOUNTER — Ambulatory Visit (HOSPITAL_COMMUNITY)
Admission: EM | Admit: 2020-03-22 | Discharge: 2020-03-22 | Disposition: A | Discharge status: Home / Self Care | Payer: Medicaid Other | Attending: Urgent Care | Admitting: Urgent Care

## 2020-03-22 DIAGNOSIS — R05 Cough: Secondary | ICD-10-CM | POA: Insufficient documentation

## 2020-03-22 DIAGNOSIS — Z20822 Contact with and (suspected) exposure to covid-19: Secondary | ICD-10-CM | POA: Diagnosis not present

## 2020-03-22 DIAGNOSIS — Z87891 Personal history of nicotine dependence: Secondary | ICD-10-CM | POA: Diagnosis not present

## 2020-03-22 DIAGNOSIS — J069 Acute upper respiratory infection, unspecified: Secondary | ICD-10-CM

## 2020-03-22 DIAGNOSIS — R0981 Nasal congestion: Secondary | ICD-10-CM | POA: Diagnosis not present

## 2020-03-22 MED ORDER — PSEUDOEPHEDRINE HCL 60 MG PO TABS: 60.0000 mg | ORAL_TABLET | Freq: Three times a day (TID) | ORAL | 0 refills | Status: DC | PRN

## 2020-03-22 MED ORDER — BENZONATATE 100 MG PO CAPS: 100.0000 mg | ORAL_CAPSULE | Freq: Three times a day (TID) | ORAL | 0 refills | Status: DC | PRN

## 2020-03-22 MED ORDER — CETIRIZINE HCL 10 MG PO TABS: 10.0000 mg | ORAL_TABLET | Freq: Every day | ORAL | 0 refills | Status: DC

## 2020-03-22 MED ORDER — PROMETHAZINE-DM 6.25-15 MG/5ML PO SYRP: 5.0000 mL | ORAL_SOLUTION | Freq: Every evening | ORAL | 0 refills | Status: DC | PRN

## 2020-03-22 NOTE — ED Provider Notes (Signed)
[facility]   MRN: [id number] DOB: [birth year]  Subjective:   [name] [name] is a 29 y.o. female presenting for 2-day history of productive cough, sinus congestion, scratchy throat, postnasal drainage.  Patient has had one sick contact, her son who tested negative for COVID-19 and strep.  Patient is a smoker.  Denies history of asthma, COPD.  Has not tried medications for relief.  No current facility-administered medications for this encounter.  Current Outpatient Medications:  .  HYDROcodone-acetaminophen (NORCO) 5-325 MG tablet, Take 1-2 tablets by mouth every 4 (four) hours as needed., Disp: 10 tablet, Rfl: 0 .  ondansetron (ZOFRAN) 4 MG tablet, Take 1 tablet (4 mg total) by mouth every 8 (eight) hours as needed for nausea., Disp: 10 tablet, Rfl: 0   No Known Allergies  Past Medical History:  Diagnosis Date  . ADHD (attention deficit hyperactivity disorder)   . ADHD (attention deficit hyperactivity disorder)   . Anxiety   . Bipolar 1 disorder (HCC)   . Bipolar 1 disorder (HCC)   . Depression   . Hypertension affecting pregnancy    HX OF HTN; NO MEDS  . IBS (irritable bowel syndrome)   . Kidney stone   . Left ureteral calculus    lithrotripsy  . Renal disorder   . Schizophrenia (HCC)    MILD  . Schizophrenia [facility])      Past Surgical History:  Procedure Laterality Date  . CYSTOSCOPY/RETROGRADE/URETEROSCOPY  08/25/2011   Procedure: CYSTOSCOPY/RETROGRADE/URETEROSCOPY;  Surgeon: Antony Haste, MD;  Location: [facility];  Service: Urology;  Laterality: Left;  cystoscopy LEFT retrograde pyelogram LEFT URETEROSCOPY holmium  LASER LITHO AND BASKET EXTRACTION C-ARM LASER  . DILATION AND EVACUATION  12/29/2011   Procedure: DILATATION AND EVACUATION;  Surgeon: Purcell Nails, MD;  Location: [facility];  Service: Gynecology;  Laterality: N/A;  . INCISION AND DRAINAGE ABSCESS N/A 04/01/2019   Procedure: OPEN INCISION AND DRAINAGE OF DEEP POSTERIOR  SPINE LUMBOSACRAL HEMATOMA;  Surgeon: Sheral Apley, MD;  Location: [facility];  Service: Orthopedics;  Laterality: N/A;  . KIDNEY STONE SURGERY  07/2011   9 MM  . WISDOM TOOTH EXTRACTION  2009- oral md office    Family History  Problem Relation Age of Onset  . Heart disease Paternal Grandfather   . Heart disease Maternal Grandmother   . Hypertension Maternal Grandmother   . Other Mother        VARICOSE VEINS  . Anxiety disorder Mother   . Diabetes Maternal Grandfather     Social History   Tobacco Use  . Smoking status: Former Smoker    Packs/day: 0.25    Types: Cigarettes    Quit date: 11/12/2011    Years since quitting: 8.3  . Smokeless tobacco: Never Used  Vaping Use  . Vaping Use: Some days  . Substances: Nicotine  Substance Use Topics  . Alcohol use: Not Currently  . Drug use: No    ROS   Objective:   Vitals: BP 127/79 (BP Location: Left Arm)   Pulse 70   Temp 99.5 F (37.5 C) (Oral)   Resp 18   SpO2 100%   Physical Exam Constitutional:      General: She is not in acute distress.    Appearance: Normal appearance. She is well-developed. She is not ill-appearing, toxic-appearing or diaphoretic.  HENT:     Head: Normocephalic and atraumatic.     Nose: Nose normal.     Mouth/Throat:  Mouth: Mucous membranes are moist.  Eyes:     Extraocular Movements: Extraocular movements intact.     Pupils: Pupils are equal, round, and reactive to light.  Cardiovascular:     Rate and Rhythm: Normal rate and regular rhythm.     Pulses: Normal pulses.     Heart sounds: Normal heart sounds. No murmur heard.  No friction rub. No gallop.   Pulmonary:     Effort: Pulmonary effort is normal. No respiratory distress.     Breath sounds: Normal breath sounds. No stridor. No wheezing, rhonchi or rales.  Skin:    General: Skin is warm and dry.     Findings: No rash.  Neurological:     Mental Status: She is alert and oriented to person, place, and time.   Psychiatric:        Mood and Affect: Mood normal.        Behavior: Behavior normal.        Thought Content: Thought content normal.       Assessment and Plan :   PDMP not reviewed this encounter.  1. Viral URI with cough   2. Nasal congestion     Will manage for viral illness such as viral URI, viral syndrome, viral rhinitis, COVID-19. Counseled patient on nature of COVID-19 including modes of transmission, diagnostic testing, management and supportive care.  Offered scripts for symptomatic relief. COVID 19 testing is pending. Counseled patient on potential for adverse effects with medications prescribed/recommended today, ER and return-to-clinic precautions discussed, patient verbalized understanding.     Wallis Bamberg, [place] 03/23/20 [phone]

## 2020-03-22 NOTE — ED Triage Notes (Signed)
Patient presents to [facility] for assessment of productive cough which can cause gagging, nasal congestion, scratchy throat.  Denies fevers.

## 2020-03-22 NOTE — Discharge Instructions (Signed)

## 2020-03-23 LAB — SARS CORONAVIRUS 2 (TAT 6-24 HRS): SARS Coronavirus 2: NEGATIVE

## 2020-06-18 IMAGING — CT CT L SPINE W/O CM
1 of 6 series · 7 of 14 positions shown, 9 images · non-contrast
Comparison: None.

CLINICAL DATA: Mid and low back pain since a slip and fall down
brick steps 02/15/2019. The patient suffered a large hematoma at the
time of the which persists. Initial encounter.

EXAM:
CT LUMBAR SPINE WITHOUT CONTRAST
TECHNIQUE: Multidetector CT imaging of the lumbar spine was performed without
intravenous contrast administration. Multiplanar CT image
reconstructions were also generated.

[Series 3: l spine soft · axial · 0.44mm/px · z∈[-332,-119]mm · 7 of 95 slices shown, 9 images]
[im 12/95  soft-tissue]
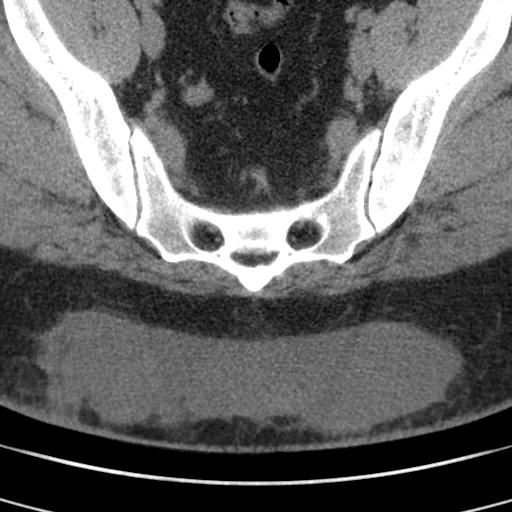
[im 12/95  bone]
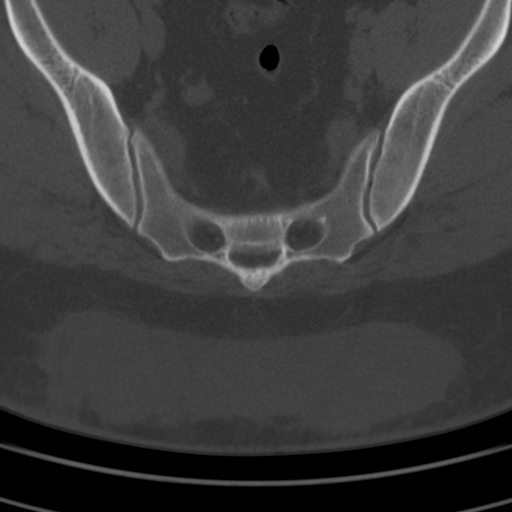
[im 24/95  bone]
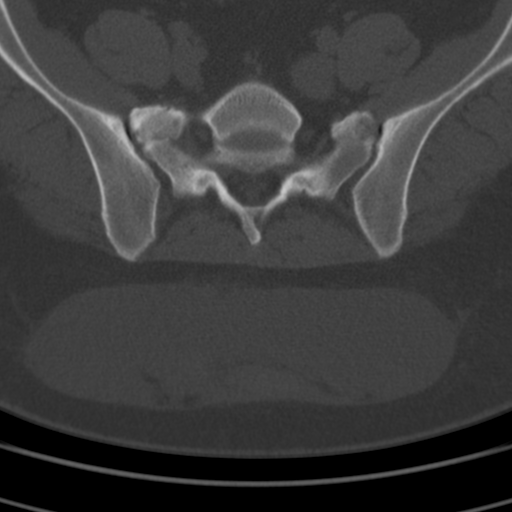
[im 36/95  bone]
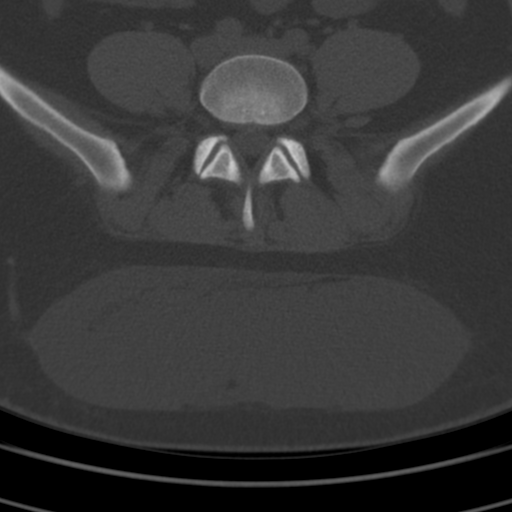
[im 48/95  bone]
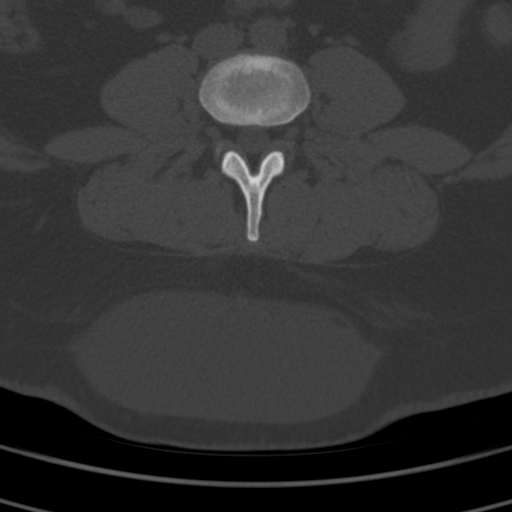
[im 59/95  soft-tissue]
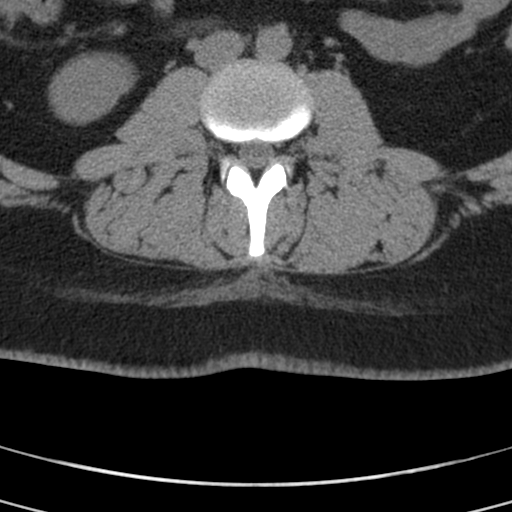
[im 59/95  bone]
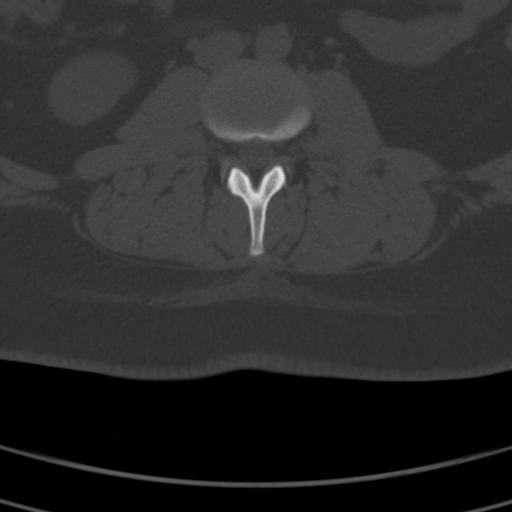
[im 71/95  bone]
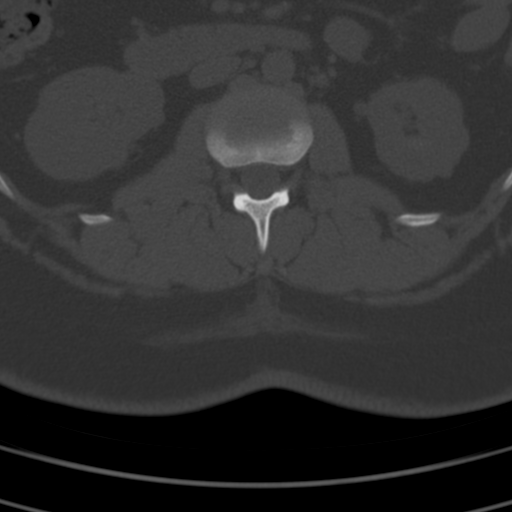
[im 83/95  bone]
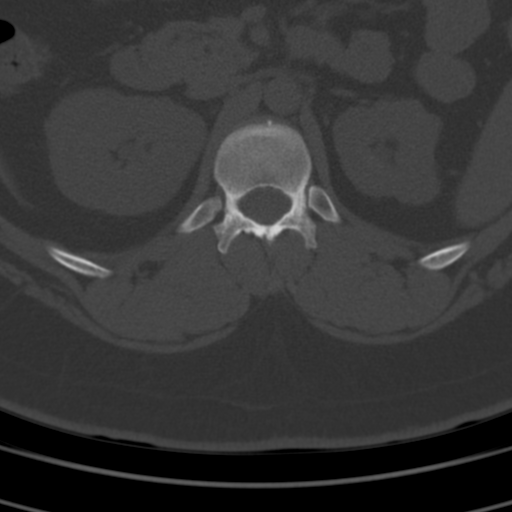

[7 of 14 positions shown; findings below may reference images not displayed]

FINDINGS: Segmentation: The patient has transitional lumbosacral anatomy.
Numbering scheme on this examination is based on the lowest
visualized pair of ribs and last fully open disc space which is
labeled L5-S1. The patient has dysplastic transverse processes of L5
bilaterally with degenerated articulations with the sacrum, more so
on the right.

Alignment: Normal.

Vertebrae: No acute fracture or focal pathologic process.

Paraspinal and other soft tissues: In the posterior subcutaneous
fatty tissues of the back, there is a collection which measures
approximately 20 cm transverse by 6 cm AP by 15 cm craniocaudal. The
collection is predominantly low attenuating but does have areas of
fat attenuation and more increased attenuation within it most
consistent with a subacute hematoma. There is no gas within the
collection or surrounding stranding to suggest superimposed
infection.

Disc levels: Disc height is maintained at all levels. The central
canal and foramina are widely patent throughout.
IMPRESSION: Large fluid collection contained within the subcutaneous tissues of
the low back is consistent with a subacute hematoma. No CT signs of
superinfection.

No acute bony abnormality. The central canal and foramina are widely
patent at all levels.

Transitional lumbosacral anatomy with dysplastic transverse
processes off L5 bilaterally which articulate with the sacrum.
Degenerative change about these articulations is worse on the right.

## 2021-01-05 ENCOUNTER — Ambulatory Visit: Payer: Medicaid Other | Admitting: Podiatry

## 2021-06-03 ENCOUNTER — Telehealth (HOSPITAL_COMMUNITY): Payer: Self-pay

## 2021-06-03 NOTE — Telephone Encounter (Signed)
Appointment - Message from [facility], referral coordinator from [facility] checking on the status of patient's referral for medication management and therapy.  Requests call back if any questions about referral.

## 2022-07-17 ENCOUNTER — Encounter (HOSPITAL_COMMUNITY): Payer: Self-pay

## 2022-07-17 ENCOUNTER — Inpatient Hospital Stay (HOSPITAL_COMMUNITY)
Admission: AD | Admit: 2022-07-17 | Discharge: 2022-07-17 | Disposition: A | Discharge status: Home / Self Care | Payer: Medicaid Other | Attending: Obstetrics and Gynecology | Admitting: Obstetrics and Gynecology

## 2022-07-17 ENCOUNTER — Other Ambulatory Visit: Payer: Self-pay

## 2022-07-17 DIAGNOSIS — Z1389 Encounter for screening for other disorder: Secondary | ICD-10-CM | POA: Diagnosis present

## 2022-07-17 DIAGNOSIS — Z3200 Encounter for pregnancy test, result unknown: Secondary | ICD-10-CM | POA: Insufficient documentation

## 2022-07-17 NOTE — MAU Provider Note (Signed)
Event Date/Time   First Provider Initiated Contact with Patient 07/17/22 1300      S [name] is a 32 y.o. [id number] patient who presents to MAU today with request for pregnancy confirmation. Positive home UPT. Period two weeks late. Plans termination. Concerned about time-sensitive nature of situation.    Denies abdominal pain, vaginal bleeding.   O BP 117/79 (BP Location: Right Arm)   Pulse 82   Temp 98.4 F (36.9 C) (Oral)   Resp 16   Ht 5\' 8"  (1.727 m)   Wt 99.1 kg   LMP 06/06/2022   SpO2 99% Comment: room air  BMI 33.22 kg/m  Physical Exam  A Medical screening exam complete Possible pregnancy, not yet confirmed  5 weeks 5 days based on regular LMP and positive home pregnancy test   P Explained that MAU does not perform pregnancy confirmation. Gave her list of providers and specified who may be able to see her soonest. Discharge from MAU in stable condition Warning signs for worsening condition that would warrant emergency follow-up discussed Patient may return to MAU as needed for pregnancy emergencies.   Tamala Julian, [place], [place] 07/17/2022 1:25 PM

## 2022-07-17 NOTE — MAU Note (Signed)
[name] [name] is a 32 y.o. here in MAU reporting: here for pregnancy confirmation. + UPT at home. Denies pain and bleeding.   LMP: 06/06/22  Onset of complaint: ongoing  Pain score: 0/10  Vitals:   07/17/22 1240  BP: 117/79  Pulse: 82  Resp: 16  Temp: 98.4 F (36.9 C)  SpO2: 99%     FHT: NA  Lab orders placed from triage: none

## 2022-07-17 NOTE — Discharge Instructions (Addendum)
Based on your last menstrual period of 06/07/22 you would be 5 weeks and 5 days with as estimated due date of 03/14/23. Please go to the provider of your choice below for care.  [place] [facility] [employer] [facility] [employer] [facility]             [address] [place], [place] [address] [phone]  [facility] [address], [place], [place] [address] Phone: [phone]  [facility] [place] [facility] [place], [place], [place], [place], [address] [phone]  Center for [facility] at [place], [place], [place], [place], [address] [phone]  Center for [facility] at [place], [place], [place], [place], [address] [phone]  Center for [place] at [facility]                                 [place], [place], [place], [address] [phone]  Center for [facility] at [facility]                                    [address], [place], [place], [address] [phone]  Center for [place] at [facility] [address], [place], [place], [place], [address]                              [facility] [place], [place], [place], [place], [address] [phone]  [facility]         Phone: [phone]  [facility]      Phone: [facility]      Phone: [place] [facility]         Phone: [phone]   [place] [facility]    Phone: [phone]  [place]      Phone: [phone]  [facility]     Phone: [phone]  [facility]        Phone:  [phone]  [facility] [place]) [phone]  [facility]      Phone: [phone]

## 2022-11-21 ENCOUNTER — Encounter (HOSPITAL_COMMUNITY): Payer: Self-pay

## 2022-12-21 ENCOUNTER — Ambulatory Visit (HOSPITAL_COMMUNITY): Payer: No Typology Code available for payment source | Admitting: Psychiatry

## 2023-01-23 ENCOUNTER — Encounter (HOSPITAL_COMMUNITY): Payer: Self-pay | Admitting: Psychiatry

## 2023-01-23 ENCOUNTER — Ambulatory Visit (HOSPITAL_COMMUNITY): Payer: MEDICAID | Admitting: Psychiatry

## 2023-01-23 DIAGNOSIS — Z8659 Personal history of other mental and behavioral disorders: Secondary | ICD-10-CM | POA: Diagnosis not present

## 2023-01-23 DIAGNOSIS — F1090 Alcohol use, unspecified, uncomplicated: Secondary | ICD-10-CM

## 2023-01-23 DIAGNOSIS — F410 Panic disorder [episodic paroxysmal anxiety] without agoraphobia: Secondary | ICD-10-CM

## 2023-01-23 DIAGNOSIS — Z789 Other specified health status: Secondary | ICD-10-CM | POA: Insufficient documentation

## 2023-01-23 DIAGNOSIS — F17200 Nicotine dependence, unspecified, uncomplicated: Secondary | ICD-10-CM | POA: Insufficient documentation

## 2023-01-23 DIAGNOSIS — F17298 Nicotine dependence, other tobacco product, with other nicotine-induced disorders: Secondary | ICD-10-CM

## 2023-01-23 DIAGNOSIS — R4589 Other symptoms and signs involving emotional state: Secondary | ICD-10-CM

## 2023-01-23 DIAGNOSIS — M549 Dorsalgia, unspecified: Secondary | ICD-10-CM

## 2023-01-23 DIAGNOSIS — F411 Generalized anxiety disorder: Secondary | ICD-10-CM

## 2023-01-23 DIAGNOSIS — F1221 Cannabis dependence, in remission: Secondary | ICD-10-CM

## 2023-01-23 DIAGNOSIS — F603 Borderline personality disorder: Secondary | ICD-10-CM | POA: Diagnosis not present

## 2023-01-23 DIAGNOSIS — G8929 Other chronic pain: Secondary | ICD-10-CM

## 2023-01-23 DIAGNOSIS — G4709 Other insomnia: Secondary | ICD-10-CM

## 2023-01-23 HISTORY — DX: Alcohol use, unspecified, uncomplicated: F10.90

## 2023-01-23 MED ORDER — DULOXETINE HCL 30 MG PO CPEP: 30.0000 mg | ORAL_CAPSULE | Freq: Every day | ORAL | 1 refills | Status: DC

## 2023-01-23 MED ORDER — BUPROPION HCL ER (SR) 200 MG PO TB12: 200.0000 mg | ORAL_TABLET | Freq: Every day | ORAL | 0 refills | Status: DC
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
## 2023-01-23 NOTE — Patient Instructions (Addendum)
We decreased the Wellbutrin for this coming month to be just 200 mg in the morning and may make further changes at your next appointment.  The reason for this is with the heavier alcohol use this can lower your seizure threshold in addition to the frequent vomiting that is taking place.  We also added Cymbalta (duloxetine) 30 mg once daily to your regimen today; take this first thing in the morning.  If you can coordinate with your PCP, we should get the following blood tests that they have not already been drawn: TSH with total T3 and free T4, iron panel, vitamin D, B12, folate, CMP, CBC and a nutrition referral would also likely be helpful.  For your insomnia try to cut back on caffeine by half a unit (unit is a can or bottle) every 5 days to avoid the withdrawal headache with a goal of 1 unit of caffeine before 12 PM.  Do your best to try and cut back on alcohol use as well as this will help your insomnia and overall mood and likely make it easier to eat (alcohol can be an irritant to the stomach).  The CDC recommends that women have no more than 1 unit of alcohol daily or a total of 7/week.  Your PCP may also want to get a sleep study to rule out any sleep apnea depending on how cutting back on caffeine and alcohol goes.  The best therapy for borderline personality disorder is DBT or dialectical behavioral therapy.  Here is the workbook that she can begin to look through on your own: https://www.mosley.info/.pdf  But it would also be helpful to reach out to your insurer to find a provider that is in network.

## 2023-02-19 ENCOUNTER — Other Ambulatory Visit (HOSPITAL_COMMUNITY): Payer: Self-pay | Admitting: Psychiatry

## 2023-02-19 DIAGNOSIS — R4589 Other symptoms and signs involving emotional state: Secondary | ICD-10-CM

## 2023-02-19 DIAGNOSIS — Z8659 Personal history of other mental and behavioral disorders: Secondary | ICD-10-CM

## 2023-02-19 DIAGNOSIS — F410 Panic disorder [episodic paroxysmal anxiety] without agoraphobia: Secondary | ICD-10-CM

## 2023-02-19 MED ORDER — BUPROPION HCL ER (SR) 200 MG PO TB12: 200.0000 mg | ORAL_TABLET | Freq: Every day | ORAL | 0 refills | Status: DC

## 2023-02-19 NOTE — Progress Notes (Signed)
This Clinical research associate is covering for Dr. Adrian Blackwater while out of office. Received refill request for Wellbutrin SR 200 mg daily. Per review of last note, it appears plan was to taper and discontinue this medication in subsequent visits. Per dispense history, patient last filled 30 day supply of medication on 01/23/23 and has f/u appointment with Dr. Adrian Blackwater on 03/01/23. Will send in 1 week supply of medication to bridge until next appointment at which point further taper/discontinuation can be discussed.  Daine Gip, MD 02/19/23

## 2023-03-01 ENCOUNTER — Telehealth (HOSPITAL_COMMUNITY): Payer: MEDICAID | Admitting: Psychiatry

## 2023-03-01 ENCOUNTER — Encounter (HOSPITAL_COMMUNITY): Payer: Self-pay | Admitting: Psychiatry

## 2023-03-01 DIAGNOSIS — R630 Anorexia: Secondary | ICD-10-CM

## 2023-03-01 DIAGNOSIS — F1090 Alcohol use, unspecified, uncomplicated: Secondary | ICD-10-CM

## 2023-03-01 DIAGNOSIS — F1590 Other stimulant use, unspecified, uncomplicated: Secondary | ICD-10-CM

## 2023-03-01 DIAGNOSIS — F603 Borderline personality disorder: Secondary | ICD-10-CM

## 2023-03-01 DIAGNOSIS — R4588 Nonsuicidal self-harm: Secondary | ICD-10-CM | POA: Diagnosis not present

## 2023-03-01 DIAGNOSIS — G4709 Other insomnia: Secondary | ICD-10-CM

## 2023-03-01 DIAGNOSIS — F17298 Nicotine dependence, other tobacco product, with other nicotine-induced disorders: Secondary | ICD-10-CM

## 2023-03-01 DIAGNOSIS — Z789 Other specified health status: Secondary | ICD-10-CM

## 2023-03-01 DIAGNOSIS — R4589 Other symptoms and signs involving emotional state: Secondary | ICD-10-CM

## 2023-03-01 DIAGNOSIS — F332 Major depressive disorder, recurrent severe without psychotic features: Secondary | ICD-10-CM

## 2023-03-01 DIAGNOSIS — F410 Panic disorder [episodic paroxysmal anxiety] without agoraphobia: Secondary | ICD-10-CM

## 2023-03-01 DIAGNOSIS — G8929 Other chronic pain: Secondary | ICD-10-CM

## 2023-03-01 DIAGNOSIS — F17210 Nicotine dependence, cigarettes, uncomplicated: Secondary | ICD-10-CM

## 2023-03-01 DIAGNOSIS — Z8659 Personal history of other mental and behavioral disorders: Secondary | ICD-10-CM

## 2023-03-01 DIAGNOSIS — F411 Generalized anxiety disorder: Secondary | ICD-10-CM

## 2023-03-01 MED ORDER — DULOXETINE HCL 60 MG PO CPEP: 60.0000 mg | ORAL_CAPSULE | Freq: Every day | ORAL | 1 refills | Status: DC

## 2023-03-01 MED ORDER — TRAZODONE HCL 50 MG PO TABS: 25.0000 mg | ORAL_TABLET | Freq: Every day | ORAL | 2 refills | Status: DC
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
## 2023-03-01 NOTE — Patient Instructions (Addendum)
We do get a chance please see if your PCP has obtained the following: CBC, CMP, vitamin D, B12, folate and a nutrition referral.  We discontinued the Wellbutrin today as it can impair sleep, appetite, and can be implicated in making folks more irritable.  We titrated the Cymbalta from 30 mg to 60 mg daily today.  This should help with the anxiety and depression as well as some of the chronic back pain.  Keep doing her best to cut back on caffeine and that should make it easier to sleep as well.  We also started trazodone 25 mg (half a tablet) nightly today.  Be sure never to mix this with alcohol as you could be dangerously sedated and not able to protect your airway.  On that note, keep up the good work with cutting back on alcohol as this should help your insomnia generally and mood.

## 2023-04-13 ENCOUNTER — Encounter (HOSPITAL_COMMUNITY): Payer: Self-pay | Admitting: Psychiatry

## 2023-04-13 ENCOUNTER — Telehealth (INDEPENDENT_AMBULATORY_CARE_PROVIDER_SITE_OTHER): Payer: MEDICAID | Admitting: Psychiatry

## 2023-04-13 DIAGNOSIS — G4709 Other insomnia: Secondary | ICD-10-CM

## 2023-04-13 DIAGNOSIS — F603 Borderline personality disorder: Secondary | ICD-10-CM

## 2023-04-13 DIAGNOSIS — R4589 Other symptoms and signs involving emotional state: Secondary | ICD-10-CM

## 2023-04-13 DIAGNOSIS — G8929 Other chronic pain: Secondary | ICD-10-CM

## 2023-04-13 DIAGNOSIS — F410 Panic disorder [episodic paroxysmal anxiety] without agoraphobia: Secondary | ICD-10-CM

## 2023-04-13 DIAGNOSIS — Z789 Other specified health status: Secondary | ICD-10-CM

## 2023-04-13 DIAGNOSIS — F411 Generalized anxiety disorder: Secondary | ICD-10-CM | POA: Diagnosis not present

## 2023-04-13 DIAGNOSIS — F17298 Nicotine dependence, other tobacco product, with other nicotine-induced disorders: Secondary | ICD-10-CM

## 2023-04-13 DIAGNOSIS — R4588 Nonsuicidal self-harm: Secondary | ICD-10-CM | POA: Diagnosis not present

## 2023-04-13 DIAGNOSIS — M549 Dorsalgia, unspecified: Secondary | ICD-10-CM

## 2023-04-13 DIAGNOSIS — F1090 Alcohol use, unspecified, uncomplicated: Secondary | ICD-10-CM

## 2023-04-13 DIAGNOSIS — Z8659 Personal history of other mental and behavioral disorders: Secondary | ICD-10-CM

## 2023-04-13 MED ORDER — DULOXETINE HCL 60 MG PO CPEP: 60.0000 mg | ORAL_CAPSULE | Freq: Every day | ORAL | 1 refills | Status: DC

## 2023-04-13 MED ORDER — QUETIAPINE FUMARATE 25 MG PO TABS: ORAL_TABLET | ORAL | 1 refills | Status: DC

## 2023-04-13 NOTE — Patient Instructions (Signed)
We started Seroquel today. Take half a tablet nightly. If this is ineffective can increase to full tablet nightly.  We also discussed cutting back on caffeine by half a unit (can of Red Bull) every 5 days until you are drinking 1 can daily.  This should help with some of the irritability and anxiety as well as your ability to sleep at night.
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
## 2023-05-03 ENCOUNTER — Encounter (HOSPITAL_COMMUNITY): Payer: Self-pay | Admitting: Psychiatry

## 2023-05-03 ENCOUNTER — Telehealth (INDEPENDENT_AMBULATORY_CARE_PROVIDER_SITE_OTHER): Payer: MEDICAID | Admitting: Psychiatry

## 2023-05-03 DIAGNOSIS — Z8659 Personal history of other mental and behavioral disorders: Secondary | ICD-10-CM

## 2023-05-03 DIAGNOSIS — F603 Borderline personality disorder: Secondary | ICD-10-CM | POA: Diagnosis not present

## 2023-05-03 DIAGNOSIS — R4589 Other symptoms and signs involving emotional state: Secondary | ICD-10-CM

## 2023-05-03 DIAGNOSIS — F5104 Psychophysiologic insomnia: Secondary | ICD-10-CM

## 2023-05-03 DIAGNOSIS — M549 Dorsalgia, unspecified: Secondary | ICD-10-CM | POA: Diagnosis not present

## 2023-05-03 DIAGNOSIS — Z789 Other specified health status: Secondary | ICD-10-CM

## 2023-05-03 DIAGNOSIS — F410 Panic disorder [episodic paroxysmal anxiety] without agoraphobia: Secondary | ICD-10-CM

## 2023-05-03 DIAGNOSIS — F411 Generalized anxiety disorder: Secondary | ICD-10-CM

## 2023-05-03 DIAGNOSIS — F17298 Nicotine dependence, other tobacco product, with other nicotine-induced disorders: Secondary | ICD-10-CM

## 2023-05-03 DIAGNOSIS — G8929 Other chronic pain: Secondary | ICD-10-CM

## 2023-05-03 DIAGNOSIS — F15982 Other stimulant use, unspecified with stimulant-induced sleep disorder: Secondary | ICD-10-CM

## 2023-05-03 DIAGNOSIS — G4709 Other insomnia: Secondary | ICD-10-CM

## 2023-05-03 MED ORDER — PROPRANOLOL HCL 10 MG PO TABS: 10.0000 mg | ORAL_TABLET | Freq: Two times a day (BID) | ORAL | 1 refills | Status: DC | PRN

## 2023-05-03 MED ORDER — HYDROXYZINE HCL 25 MG PO TABS: 25.0000 mg | ORAL_TABLET | Freq: Every evening | ORAL | 1 refills | Status: DC

## 2023-05-03 MED ORDER — DULOXETINE HCL 30 MG PO CPEP: 90.0000 mg | ORAL_CAPSULE | Freq: Every day | ORAL | 2 refills | Status: DC

## 2023-05-03 NOTE — Patient Instructions (Signed)
We discontinued the Seroquel since it was too sedating.  We replaced it with hydroxyzine 25 mg nightly.  We also added propranolol 10 mg twice daily as needed for panic attacks.  Be on the lookout for low blood pressure with lightheadedness when going from sitting to standing up.  Keep up the good work with cutting back on caffeine that should help with the insomnia, anxiety, irritability.  We also increased the Cymbalta (duloxetine) to 90 mg once daily today.
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
## 2023-05-11 ENCOUNTER — Telehealth (INDEPENDENT_AMBULATORY_CARE_PROVIDER_SITE_OTHER): Payer: MEDICAID | Admitting: Psychiatry

## 2023-05-11 ENCOUNTER — Encounter (HOSPITAL_COMMUNITY): Payer: Self-pay | Admitting: Psychiatry

## 2023-05-11 DIAGNOSIS — F1510 Other stimulant abuse, uncomplicated: Secondary | ICD-10-CM

## 2023-05-11 DIAGNOSIS — F410 Panic disorder [episodic paroxysmal anxiety] without agoraphobia: Secondary | ICD-10-CM | POA: Diagnosis not present

## 2023-05-11 DIAGNOSIS — Z789 Other specified health status: Secondary | ICD-10-CM

## 2023-05-11 DIAGNOSIS — F411 Generalized anxiety disorder: Secondary | ICD-10-CM

## 2023-05-11 DIAGNOSIS — F17298 Nicotine dependence, other tobacco product, with other nicotine-induced disorders: Secondary | ICD-10-CM

## 2023-05-11 DIAGNOSIS — F603 Borderline personality disorder: Secondary | ICD-10-CM

## 2023-05-11 DIAGNOSIS — F4310 Post-traumatic stress disorder, unspecified: Secondary | ICD-10-CM

## 2023-05-11 DIAGNOSIS — G4709 Other insomnia: Secondary | ICD-10-CM
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
## 2023-05-11 NOTE — Patient Instructions (Signed)
We did not make any medication changes today.  Remember the self affirming phrases that we were using today.  They can help you when you are getting triggered with peers and teachers.

## 2023-06-08 ENCOUNTER — Telehealth (HOSPITAL_COMMUNITY): Payer: MEDICAID | Admitting: Psychiatry

## 2023-07-02 ENCOUNTER — Telehealth (INDEPENDENT_AMBULATORY_CARE_PROVIDER_SITE_OTHER): Payer: MEDICAID | Admitting: Psychiatry

## 2023-07-02 ENCOUNTER — Encounter (HOSPITAL_COMMUNITY): Payer: Self-pay | Admitting: Psychiatry

## 2023-07-02 DIAGNOSIS — F411 Generalized anxiety disorder: Secondary | ICD-10-CM

## 2023-07-02 DIAGNOSIS — M549 Dorsalgia, unspecified: Secondary | ICD-10-CM

## 2023-07-02 DIAGNOSIS — F17298 Nicotine dependence, other tobacco product, with other nicotine-induced disorders: Secondary | ICD-10-CM

## 2023-07-02 DIAGNOSIS — R4589 Other symptoms and signs involving emotional state: Secondary | ICD-10-CM

## 2023-07-02 DIAGNOSIS — Z8659 Personal history of other mental and behavioral disorders: Secondary | ICD-10-CM

## 2023-07-02 DIAGNOSIS — F603 Borderline personality disorder: Secondary | ICD-10-CM | POA: Diagnosis not present

## 2023-07-02 DIAGNOSIS — F410 Panic disorder [episodic paroxysmal anxiety] without agoraphobia: Secondary | ICD-10-CM

## 2023-07-02 DIAGNOSIS — F4310 Post-traumatic stress disorder, unspecified: Secondary | ICD-10-CM

## 2023-07-02 DIAGNOSIS — F5104 Psychophysiologic insomnia: Secondary | ICD-10-CM

## 2023-07-02 DIAGNOSIS — G4709 Other insomnia: Secondary | ICD-10-CM

## 2023-07-02 DIAGNOSIS — G8929 Other chronic pain: Secondary | ICD-10-CM

## 2023-07-02 MED ORDER — PROPRANOLOL HCL 10 MG PO TABS: 10.0000 mg | ORAL_TABLET | Freq: Three times a day (TID) | ORAL | 2 refills | Status: DC | PRN

## 2023-07-02 MED ORDER — DULOXETINE HCL 60 MG PO CPEP: 120.0000 mg | ORAL_CAPSULE | Freq: Every day | ORAL | 2 refills | Status: DC

## 2023-07-02 MED ORDER — HYDROXYZINE HCL 25 MG PO TABS: 25.0000 mg | ORAL_TABLET | Freq: Every evening | ORAL | 2 refills | Status: AC

## 2023-07-02 NOTE — Patient Instructions (Signed)
We increased the Cymbalta to 120 mg once daily today.  Please check with your pharmacist to see if removing the beads from the capsules still is effective for this medication.  I made a referral to the partial hospitalization program for intensive therapy so please be on the lookout for a phone call from them.
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
## 2023-07-03 ENCOUNTER — Telehealth (HOSPITAL_COMMUNITY): Payer: Self-pay | Admitting: Licensed Clinical Social Worker

## 2023-07-04 ENCOUNTER — Telehealth (HOSPITAL_COMMUNITY): Payer: Self-pay | Admitting: Professional

## 2023-07-04 NOTE — Telephone Encounter (Signed)
Opened by mistake.

## 2023-08-07 ENCOUNTER — Encounter (HOSPITAL_COMMUNITY): Payer: Self-pay | Admitting: Psychiatry

## 2023-08-07 ENCOUNTER — Telehealth (HOSPITAL_COMMUNITY): Payer: MEDICAID | Admitting: Psychiatry

## 2023-08-07 DIAGNOSIS — F603 Borderline personality disorder: Secondary | ICD-10-CM | POA: Diagnosis not present

## 2023-08-07 DIAGNOSIS — Z8659 Personal history of other mental and behavioral disorders: Secondary | ICD-10-CM

## 2023-08-07 DIAGNOSIS — F411 Generalized anxiety disorder: Secondary | ICD-10-CM

## 2023-08-07 DIAGNOSIS — M549 Dorsalgia, unspecified: Secondary | ICD-10-CM

## 2023-08-07 DIAGNOSIS — G8929 Other chronic pain: Secondary | ICD-10-CM

## 2023-08-07 DIAGNOSIS — F4310 Post-traumatic stress disorder, unspecified: Secondary | ICD-10-CM

## 2023-08-07 DIAGNOSIS — R4589 Other symptoms and signs involving emotional state: Secondary | ICD-10-CM

## 2023-08-07 DIAGNOSIS — F410 Panic disorder [episodic paroxysmal anxiety] without agoraphobia: Secondary | ICD-10-CM

## 2023-08-07 MED ORDER — DULOXETINE HCL 30 MG PO CPEP: 90.0000 mg | ORAL_CAPSULE | Freq: Every day | ORAL | 2 refills | Status: AC

## 2023-08-07 MED ORDER — PROPRANOLOL HCL 20 MG PO TABS: 20.0000 mg | ORAL_TABLET | Freq: Two times a day (BID) | ORAL | 2 refills | Status: AC | PRN
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
## 2023-08-07 NOTE — Patient Instructions (Addendum)
We restarted the propranolol at 20 mg once daily as needed for anxiety and after you have been on this for about a week and increase to 20 as needed for anxiety.  We kept the Cymbalta at 90 mg once daily.  Please contact your insurer to find a therapist that is in network that practices CBT (cognitive behavioral therapy) as finding a DBT provider can be difficult but here is the DBT workbook which is the most effective for borderline personality disorder: https://www.mosley.info/.pdf  Keep up the good work with staying off of alcohol and cutting back on caffeine.

## 2023-08-27 ENCOUNTER — Encounter (HOSPITAL_COMMUNITY): Payer: Self-pay | Admitting: Psychiatry

## 2023-08-27 ENCOUNTER — Telehealth (INDEPENDENT_AMBULATORY_CARE_PROVIDER_SITE_OTHER): Payer: MEDICAID | Admitting: Psychiatry

## 2023-08-27 DIAGNOSIS — F410 Panic disorder [episodic paroxysmal anxiety] without agoraphobia: Secondary | ICD-10-CM | POA: Diagnosis not present

## 2023-08-27 DIAGNOSIS — F411 Generalized anxiety disorder: Secondary | ICD-10-CM

## 2023-08-27 DIAGNOSIS — F603 Borderline personality disorder: Secondary | ICD-10-CM | POA: Diagnosis not present

## 2023-08-27 DIAGNOSIS — F4310 Post-traumatic stress disorder, unspecified: Secondary | ICD-10-CM

## 2023-08-27 DIAGNOSIS — G4709 Other insomnia: Secondary | ICD-10-CM

## 2023-08-27 DIAGNOSIS — F17298 Nicotine dependence, other tobacco product, with other nicotine-induced disorders: Secondary | ICD-10-CM

## 2023-08-27 NOTE — Patient Instructions (Addendum)
 We did not make any medication changes today.  Keep up the good work with cutting back on caffeine.  You may want to look into this resource for the DBT (dialectical behavioral therapy) workbook as it is most helpful therapy for borderline personality disorder: https://www.mosley.info/.pdf
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
## 2023-09-24 ENCOUNTER — Telehealth (HOSPITAL_COMMUNITY): Payer: Self-pay

## 2023-09-24 ENCOUNTER — Encounter (HOSPITAL_COMMUNITY): Payer: Self-pay | Admitting: Psychiatry

## 2023-09-24 ENCOUNTER — Telehealth (HOSPITAL_COMMUNITY): Payer: MEDICAID | Admitting: Psychiatry

## 2023-09-24 NOTE — Telephone Encounter (Signed)
 Pt called  in inquiring about not knowing that she had an appt today, advised to her appt was at 1:30 and she no showed the appt. Advised to her that Dr Adrian Blackwater has dismissed her per policy due to having 3 no shows in the last year and dismissal letter has been drawn up. Advised to pt that she no showed today, 06/08/23, and 12/21/22. She states that her 12/21/22 appt was not suppose to have been a no show due to her never receiving a link and her and Dr Adrian Blackwater had already talked about it. Pt is wanting to have Dr Adrian Blackwater call her when he is available.

## 2023-12-17 NOTE — Progress Notes (Signed)
 Chief Complaint: No chief complaint on file.   History of Present Illness:  [name] [name] is a 33 y.o. female here for E/M of a recently diagnosed Lt proximal ureteral stone. She has had kidney stones before, managed at [facility] urology in [place].  She became symptomatic just under a month ago with left flank pain, nausea and vomiting.  She went to [facility], to the emergency room where CT was performed revealing a 5 x 7 mm left proximal ureteral stone with hydronephrosis.  Punctate left renal stones were also present.  She has had some change in the location of her pain, currently has some urgency and frequency as well as feeling of incomplete emptying.  No discrete fever.  Urine has been darker.    Past Medical History:  Past Medical History:  Diagnosis Date   ADHD (attention deficit hyperactivity disorder)    ADHD (attention deficit hyperactivity disorder)    Alcohol use disorder 01/23/2023   Anxiety    Bipolar 1 disorder (HCC)    Bipolar 1 disorder (HCC)    Chorioamnionitis 11/06/2013   Depression    Hx of pre-eclampsia in prior pregnancy, currently pregnant 11/05/2013   Hypertension affecting pregnancy    HX OF HTN; NO MEDS   IBS (irritable bowel syndrome)    Kidney stone    Left ureteral calculus    lithrotripsy   Missed abortion 12/28/2011   Renal disorder    Schizoaffective disorder (HCC) 11/05/2013   Schizophrenia (HCC)    MILD   Schizophrenia (HCC)    Vaginal delivery 11/06/2013    Past Surgical History:  Past Surgical History:  Procedure Laterality Date   CYSTOSCOPY/RETROGRADE/URETEROSCOPY  08/25/2011   Procedure: CYSTOSCOPY/RETROGRADE/URETEROSCOPY;  Surgeon: Moise Anes, MD;  Location: [facility];  Service: Urology;  Laterality: Left;  cystoscopy LEFT retrograde pyelogram LEFT URETEROSCOPY holmium  LASER LITHO AND BASKET EXTRACTION C-ARM LASER   DILATION AND EVACUATION  12/29/2011   Procedure: DILATATION  AND EVACUATION;  Surgeon: Madelene Schanz, MD;  Location: [facility];  Service: Gynecology;  Laterality: N/A;   INCISION AND DRAINAGE ABSCESS N/A 04/01/2019   Procedure: OPEN INCISION AND DRAINAGE OF DEEP POSTERIOR SPINE LUMBOSACRAL HEMATOMA;  Surgeon: Saundra Curl, MD;  Location: [facility];  Service: Orthopedics;  Laterality: N/A;   KIDNEY STONE SURGERY  07/2011   9 MM   WISDOM TOOTH EXTRACTION  2009- oral md office    Allergies:  No Known Allergies  Family History:  Family History  Problem Relation Age of Onset   Heart disease Paternal Grandfather    Heart disease Maternal Grandmother    Hypertension Maternal Grandmother    Other Mother        VARICOSE VEINS   Anxiety disorder Mother    Diabetes Maternal Grandfather     Social History:  Social History   Tobacco Use   Smoking status: Every Day    Current packs/day: 0.00    Types: Cigarettes, E-cigarettes    Last attempt to quit: 11/12/2011    Years since quitting: 12.1   Smokeless tobacco: Never   Tobacco comments:    Vape cartridge lasts 2 weeks.  Started smoking cigarettes at age 71 and now infrequent use  Vaping Use   Vaping status: Some Days   Substances: Nicotine  Substance Use Topics   Alcohol use: Not Currently    Alcohol/week: 4.0 standard drinks of alcohol    Types: 2 Glasses of wine, 2 Cans of beer per  week    Comment: Social 1-2 times per month as of 07/02/2023. Previously, 2 beers per week, with 2 glasses of wine on the weekend   Drug use: Not Currently    Types: "Crack" cocaine, Oxycodone , Marijuana, Cocaine    Comment: See psychiatry note from 01/23/2023    Review of symptoms:  Constitutional:  Negative for unexplained weight loss, night sweats, fever, chills ENT:  Negative for nose bleeds, sinus pain, painful swallowing CV:  Negative for chest pain, shortness of breath, exercise intolerance, palpitations, loss of consciousness Resp:  Negative for cough, wheezing, shortness of  breath GI:  Negative for nausea, vomiting, diarrhea, bloody stools GU:  Positives noted in HPI; otherwise negative for gross hematuria, dysuria, urinary incontinence Neuro:  Negative for seizures, poor balance, limb weakness, slurred speech Psych:  Negative for lack of energy, depression, anxiety Endocrine:  Negative for polydipsia, polyuria, symptoms of hypoglycemia (dizziness, hunger, sweating) Hematologic:  Negative for anemia, purpura, petechia, prolonged or excessive bleeding, use of anticoagulants  Allergic:  Negative for difficulty breathing or choking as a result of exposure to anything; no shellfish allergy; no allergic response (rash/itch) to materials, foods  Physical exam: There were no vitals taken for this visit. GENERAL APPEARANCE:  Well appearing, well developed, well nourished, NAD HEENT: Atraumatic, Normocephalic, oropharynx clear. NECK: Supple without lymphadenopathy or thyromegaly. LUNGS: Clear to auscultation bilaterally. HEART: Regular Rate and Rhythm without murmurs, gallops, or rubs MENTAL STATUS:  Appropriate. BACK:  Non-tender to palpation.  No CVAT SKIN:  Warm, dry and intact.    Results: No results found for this or any previous visit (from the past 24 hours).  I have reviewed prior patient's records  I have reviewed referring/prior physicians [facility]  No 24-hour urine results available  I have reviewed urinalysis--no evidence of infection  I reviewed prior imaging studies--CT results from [facility] reviewed  Assessment: Left proximal ureteral stone at time of original diagnosis/CT last month.  Perhaps distal progression   Plan: -I am sending her out for KUB today  -I will send her tamsulosin  -Will call with results of KUB and plans thereafter

## 2023-12-18 ENCOUNTER — Other Ambulatory Visit: Payer: Self-pay | Admitting: Urology

## 2023-12-18 ENCOUNTER — Ambulatory Visit: Payer: Self-pay

## 2023-12-18 ENCOUNTER — Ambulatory Visit (INDEPENDENT_AMBULATORY_CARE_PROVIDER_SITE_OTHER): Payer: MEDICAID | Admitting: Urology

## 2023-12-18 ENCOUNTER — Ambulatory Visit (HOSPITAL_COMMUNITY)
Admission: RE | Admit: 2023-12-18 | Discharge: 2023-12-18 | Disposition: A | Discharge status: Home / Self Care | Payer: MEDICAID | Source: Ambulatory Visit | Attending: Urology | Admitting: Urology

## 2023-12-18 VITALS — BP 141/81 | HR 94

## 2023-12-18 DIAGNOSIS — N201 Calculus of ureter: Secondary | ICD-10-CM | POA: Insufficient documentation

## 2023-12-18 DIAGNOSIS — N1330 Unspecified hydronephrosis: Secondary | ICD-10-CM | POA: Diagnosis not present

## 2023-12-18 LAB — URINALYSIS, ROUTINE W REFLEX MICROSCOPIC
Bilirubin, UA: NEGATIVE
Glucose, UA: NEGATIVE
Ketones, UA: NEGATIVE
Nitrite, UA: NEGATIVE
Specific Gravity, UA: 1.015 (ref 1.005–1.030)
Urobilinogen, Ur: 0.2 mg/dL (ref 0.2–1.0)
pH, UA: 6 (ref 5.0–7.5)

## 2023-12-18 LAB — MICROSCOPIC EXAMINATION: RBC, Urine: >30 /HPF — AB (ref 0–2)

## 2023-12-18 MED ORDER — TAMSULOSIN HCL 0.4 MG PO CAPS: 0.4000 mg | ORAL_CAPSULE | Freq: Every day | ORAL | 11 refills | Status: AC

## 2023-12-18 NOTE — Telephone Encounter (Signed)
 Pt called to notify pt of MD response to xray pt did not answer lvm to c/b

## 2023-12-18 NOTE — Telephone Encounter (Signed)
-----   Message from Malcolm Scrivener Dahlstedt sent at 12/18/2023  4:55 PM EDT ----- Please call patient-the plain x-ray that she got did not show stones.  She may have stones that are not visible on plain x-ray i.e. uric acid.  If she is still having pain, to follow this up I would recommend a CT scan.  If she wants to go ahead with the CT, let me know. ----- Message ----- From: Interface, Rad Results In Sent: 12/18/2023  12:58 PM EDT To: Trent Frizzle, MD

## 2023-12-19 NOTE — Telephone Encounter (Signed)
 Pt called to receive KUB results (see previous encounter for details) pt stated she wanted to move forward with the CT offered by MD pt stated she is sure there is a stone there pt advised that the MD would be notfied the CT would go through insurance for approval then someone from our office would reach out to schedule appt for CT

## 2023-12-24 ENCOUNTER — Other Ambulatory Visit: Payer: Self-pay | Admitting: Urology

## 2023-12-24 DIAGNOSIS — N201 Calculus of ureter: Secondary | ICD-10-CM

## 2023-12-31 ENCOUNTER — Emergency Department (HOSPITAL_BASED_OUTPATIENT_CLINIC_OR_DEPARTMENT_OTHER): Payer: MEDICAID | Admitting: Registered Nurse

## 2023-12-31 ENCOUNTER — Emergency Department (HOSPITAL_COMMUNITY): Payer: MEDICAID

## 2023-12-31 ENCOUNTER — Other Ambulatory Visit: Payer: Self-pay

## 2023-12-31 ENCOUNTER — Encounter (HOSPITAL_COMMUNITY): Payer: Self-pay

## 2023-12-31 ENCOUNTER — Encounter (HOSPITAL_COMMUNITY)
Admission: EM | Disposition: A | Discharge status: Home / Self Care | Payer: Self-pay | Source: Home / Self Care | Attending: Emergency Medicine

## 2023-12-31 ENCOUNTER — Emergency Department (HOSPITAL_COMMUNITY): Payer: MEDICAID | Admitting: Registered Nurse

## 2023-12-31 ENCOUNTER — Ambulatory Visit (HOSPITAL_COMMUNITY)
Admission: EM | Admit: 2023-12-31 | Discharge: 2023-12-31 | Disposition: A | Discharge status: Home / Self Care | Payer: MEDICAID | Attending: Urology | Admitting: Urology

## 2023-12-31 DIAGNOSIS — F319 Bipolar disorder, unspecified: Secondary | ICD-10-CM | POA: Insufficient documentation

## 2023-12-31 DIAGNOSIS — N201 Calculus of ureter: Secondary | ICD-10-CM

## 2023-12-31 DIAGNOSIS — N200 Calculus of kidney: Secondary | ICD-10-CM | POA: Diagnosis present

## 2023-12-31 DIAGNOSIS — I10 Essential (primary) hypertension: Secondary | ICD-10-CM | POA: Diagnosis not present

## 2023-12-31 DIAGNOSIS — F419 Anxiety disorder, unspecified: Secondary | ICD-10-CM | POA: Insufficient documentation

## 2023-12-31 DIAGNOSIS — N202 Calculus of kidney with calculus of ureter: Secondary | ICD-10-CM | POA: Diagnosis not present

## 2023-12-31 DIAGNOSIS — R109 Unspecified abdominal pain: Secondary | ICD-10-CM | POA: Diagnosis present

## 2023-12-31 DIAGNOSIS — F17210 Nicotine dependence, cigarettes, uncomplicated: Secondary | ICD-10-CM | POA: Insufficient documentation

## 2023-12-31 HISTORY — PX: CYSTOSCOPY/URETEROSCOPY/HOLMIUM LASER/STENT PLACEMENT: SHX6546

## 2023-12-31 SURGERY — CYSTOSCOPY/URETEROSCOPY/HOLMIUM LASER/STENT PLACEMENT
Anesthesia: General

## 2023-12-31 MED ORDER — OXYCODONE HCL 5 MG PO TABS: 5.0000 mg | ORAL_TABLET | ORAL | Status: DC | PRN

## 2023-12-31 MED ORDER — PROPOFOL 10 MG/ML IV BOLUS
INTRAVENOUS | Status: AC
  Filled 2023-12-31: qty 20

## 2023-12-31 MED ORDER — SODIUM CHLORIDE 0.9 % IR SOLN
Status: DC | PRN
Start: 2023-12-31 — End: 2024-01-01
  Administered 2023-12-31: 3000 mL via INTRAVESICAL

## 2023-12-31 MED ORDER — OXYCODONE HCL 5 MG/5ML PO SOLN: 5.0000 mg | Freq: Once | ORAL | Status: DC | PRN

## 2023-12-31 MED ORDER — LACTATED RINGERS IV SOLN: INTRAVENOUS | Status: DC

## 2023-12-31 MED ORDER — FLEET ENEMA RE ENEM: 1.0000 | ENEMA | Freq: Once | RECTAL | Status: DC | PRN

## 2023-12-31 MED ORDER — BISACODYL 10 MG RE SUPP: 10.0000 mg | Freq: Every day | RECTAL | Status: DC | PRN

## 2023-12-31 MED ORDER — SENNOSIDES-DOCUSATE SODIUM 8.6-50 MG PO TABS: 1.0000 | ORAL_TABLET | Freq: Every evening | ORAL | Status: DC | PRN

## 2023-12-31 MED ORDER — LIDOCAINE HCL (CARDIAC) PF 100 MG/5ML IV SOSY: PREFILLED_SYRINGE | INTRAVENOUS | Status: DC | PRN

## 2023-12-31 MED ORDER — ACETAMINOPHEN 10 MG/ML IV SOLN: 1000.0000 mg | Freq: Once | INTRAVENOUS | Status: DC | PRN

## 2023-12-31 MED ORDER — ONDANSETRON HCL 4 MG/2ML IJ SOLN
4.0000 mg | INTRAMUSCULAR | Status: DC | PRN
Start: 2023-12-31 — End: 2023-12-31

## 2023-12-31 MED ORDER — FENTANYL CITRATE (PF) 100 MCG/2ML IJ SOLN
INTRAMUSCULAR | Status: AC
Start: 2023-12-31 — End: 2023-12-31
  Filled 2023-12-31: qty 2

## 2023-12-31 MED ORDER — HYDROMORPHONE HCL 1 MG/ML IJ SOLN: 0.5000 mg | INTRAMUSCULAR | Status: DC | PRN

## 2023-12-31 MED ORDER — DEXMEDETOMIDINE HCL IN NACL 80 MCG/20ML IV SOLN
INTRAVENOUS | Status: AC
  Filled 2023-12-31: qty 20

## 2023-12-31 MED ORDER — OXYCODONE-ACETAMINOPHEN 5-325 MG PO TABS: 1.0000 | ORAL_TABLET | Freq: Four times a day (QID) | ORAL | 0 refills | Status: AC | PRN

## 2023-12-31 MED ORDER — HYOSCYAMINE SULFATE 0.125 MG SL SUBL: 0.1250 mg | SUBLINGUAL_TABLET | SUBLINGUAL | Status: DC | PRN

## 2023-12-31 MED ORDER — DEXAMETHASONE SODIUM PHOSPHATE 10 MG/ML IJ SOLN
INTRAMUSCULAR | Status: DC | PRN
  Administered 2023-12-31: 4 mg via INTRAVENOUS

## 2023-12-31 MED ORDER — SODIUM CHLORIDE 0.9% FLUSH: 3.0000 mL | Freq: Two times a day (BID) | INTRAVENOUS | Status: DC

## 2023-12-31 MED ORDER — IOHEXOL 300 MG/ML  SOLN
INTRAMUSCULAR | Status: DC | PRN
  Administered 2023-12-31: 3 mL

## 2023-12-31 MED ORDER — ONDANSETRON HCL 4 MG/2ML IJ SOLN
INTRAMUSCULAR | Status: AC
  Filled 2023-12-31: qty 2

## 2023-12-31 MED ORDER — DEXAMETHASONE SODIUM PHOSPHATE 10 MG/ML IJ SOLN
INTRAMUSCULAR | Status: AC
Start: 2023-12-31 — End: 2023-12-31
  Filled 2023-12-31: qty 1

## 2023-12-31 MED ORDER — ONDANSETRON 4 MG PO TBDP
4.0000 mg | ORAL_TABLET | Freq: Once | ORAL | Status: AC
  Filled 2023-12-31: qty 1

## 2023-12-31 MED ORDER — PROPOFOL 10 MG/ML IV BOLUS
INTRAVENOUS | Status: DC | PRN
  Administered 2023-12-31: 200 mg via INTRAVENOUS

## 2023-12-31 MED ORDER — SODIUM CHLORIDE 0.9 % IV SOLN: 1.0000 g | Freq: Once | INTRAVENOUS | Status: DC

## 2023-12-31 MED ORDER — CHLORHEXIDINE GLUCONATE 0.12 % MT SOLN: 15.0000 mL | Freq: Once | OROMUCOSAL | Status: AC

## 2023-12-31 MED ORDER — FENTANYL CITRATE PF 50 MCG/ML IJ SOSY: 25.0000 ug | PREFILLED_SYRINGE | INTRAMUSCULAR | Status: DC | PRN

## 2023-12-31 MED ORDER — LIDOCAINE HCL (PF) 2 % IJ SOLN
INTRAMUSCULAR | Status: AC
  Filled 2023-12-31: qty 5

## 2023-12-31 MED ORDER — FENTANYL CITRATE (PF) 100 MCG/2ML IJ SOLN
INTRAMUSCULAR | Status: DC | PRN
  Administered 2023-12-31 (×2): 50 ug via INTRAVENOUS

## 2023-12-31 MED ORDER — MIDAZOLAM HCL 2 MG/2ML IJ SOLN
INTRAMUSCULAR | Status: AC
Start: 2023-12-31 — End: 2023-12-31
  Filled 2023-12-31: qty 2

## 2023-12-31 MED ORDER — KETOROLAC TROMETHAMINE 15 MG/ML IJ SOLN
15.0000 mg | Freq: Once | INTRAMUSCULAR | Status: AC
  Filled 2023-12-31: qty 1

## 2023-12-31 MED ORDER — DROPERIDOL 2.5 MG/ML IJ SOLN: 0.6250 mg | Freq: Once | INTRAMUSCULAR | Status: DC | PRN

## 2023-12-31 MED ORDER — MIDAZOLAM HCL 5 MG/5ML IJ SOLN
INTRAMUSCULAR | Status: DC | PRN
  Administered 2023-12-31: 2 mg via INTRAVENOUS

## 2023-12-31 MED ORDER — LACTATED RINGERS IV SOLN: INTRAVENOUS | Status: DC | PRN

## 2023-12-31 MED ORDER — DEXMEDETOMIDINE HCL IN NACL 80 MCG/20ML IV SOLN
INTRAVENOUS | Status: DC | PRN
  Administered 2023-12-31: 12 ug via INTRAVENOUS

## 2023-12-31 MED ORDER — SODIUM CHLORIDE 0.9% FLUSH: 3.0000 mL | INTRAVENOUS | Status: DC | PRN

## 2023-12-31 MED ORDER — ACETAMINOPHEN 325 MG PO TABS: 650.0000 mg | ORAL_TABLET | ORAL | Status: DC | PRN

## 2023-12-31 MED ORDER — OXYCODONE HCL 5 MG PO TABS: 5.0000 mg | ORAL_TABLET | Freq: Once | ORAL | Status: DC | PRN

## 2023-12-31 SURGICAL SUPPLY — 21 items
BAG URO CATCHER STRL LF (MISCELLANEOUS) ×2 IMPLANT
BASKET STONE NCOMPASS (UROLOGICAL SUPPLIES) IMPLANT
CATH URETERAL DUAL LUMEN 10F (MISCELLANEOUS) IMPLANT
CATH URETL OPEN 5X70 (CATHETERS) IMPLANT
CLOTH BEACON ORANGE TIMEOUT ST (SAFETY) ×2 IMPLANT
EXTRACTOR STONE NITINOL NGAGE (UROLOGICAL SUPPLIES) IMPLANT
GLOVE SURG SS PI 8.0 STRL IVOR (GLOVE) ×2 IMPLANT
GOWN STRL SURGICAL XL XLNG (GOWN DISPOSABLE) ×2 IMPLANT
GUIDEWIRE STR DUAL SENSOR (WIRE) ×2 IMPLANT
KIT TURNOVER KIT A (KITS) ×4 IMPLANT
LASER FIB FLEXIVA PULSE ID 365 (Laser) IMPLANT
LASER FIB FLEXIVA PULSE ID 550 (Laser) IMPLANT
LASER FIB FLEXIVA PULSE ID 910 (Laser) IMPLANT
MANIFOLD NEPTUNE II (INSTRUMENTS) ×2 IMPLANT
PACK CYSTO (CUSTOM PROCEDURE TRAY) ×2 IMPLANT
SHEATH NAVIGATOR HD 11/13X28 (SHEATH) IMPLANT
SHEATH NAVIGATOR HD 11/13X36 (SHEATH) IMPLANT
STENT URET 6FRX24 CONTOUR (STENTS) IMPLANT
TRACTIP FLEXIVA PULS ID 200XHI (Laser) IMPLANT
TUBING CONNECTING 10 (TUBING) ×2 IMPLANT
TUBING UROLOGY SET (TUBING) ×2 IMPLANT

## 2023-12-31 NOTE — Anesthesia Preprocedure Evaluation (Addendum)
 Anesthesia Evaluation  Patient identified by MRN, date of birth, ID band Patient awake    Reviewed: Allergy & Precautions, NPO status , Patient's Chart, lab work & pertinent test results  Airway Mallampati: I  TM Distance: >3 FB Neck ROM: Full    Dental  (+) Teeth Intact, Dental Advisory Given   Pulmonary Current Smoker and Patient abstained from smoking.   breath sounds clear to auscultation       Cardiovascular hypertension,  Rhythm:Regular Rate:Normal     Neuro/Psych  PSYCHIATRIC DISORDERS Anxiety Depression Bipolar Disorder      GI/Hepatic negative GI ROS, Neg liver ROS,,,  Endo/Other  negative endocrine ROS    Renal/GU Renal disease     Musculoskeletal negative musculoskeletal ROS (+)    Abdominal   Peds  Hematology negative hematology ROS (+)   Anesthesia Other Findings   Reproductive/Obstetrics                             Anesthesia Physical Anesthesia Plan  ASA: 2  Anesthesia Plan: General   Post-op Pain Management: Tylenol  PO (pre-op)*   Induction: Intravenous  PONV Risk Score and Plan: 3 and Ondansetron , Dexamethasone  and Midazolam   Airway Management Planned: Oral ETT and LMA  Additional Equipment: None  Intra-op Plan:   Post-operative Plan: Extubation in OR  Informed Consent: I have reviewed the patients History and Physical, chart, labs and discussed the procedure including the risks, benefits and alternatives for the proposed anesthesia with the patient or authorized representative who has indicated his/her understanding and acceptance.     Dental advisory given  Plan Discussed with: CRNA  Anesthesia Plan Comments:         Anesthesia Quick Evaluation

## 2023-12-31 NOTE — Anesthesia Postprocedure Evaluation (Signed)
 Anesthesia Post Note  Patient: [name] [name]  Procedure(s) Performed: CYSTOSCOPY/URETEROSCOPY/HOLMIUM LASER/STENT PLACEMENT     Patient location during evaluation: PACU Anesthesia Type: General Level of consciousness: awake and alert Pain management: pain level controlled Vital Signs Assessment: post-procedure vital signs reviewed and stable Respiratory status: spontaneous breathing, nonlabored ventilation, respiratory function stable and patient connected to nasal cannula oxygen Cardiovascular status: blood pressure returned to baseline and stable Postop Assessment: no apparent nausea or vomiting Anesthetic complications: no  No notable events documented.  Last Vitals:  Vitals:   12/31/23 1953 12/31/23 2000  BP:  130/72  Pulse: 70 73  Resp:    Temp:  36.8 C  SpO2: 99% 100%    Last Pain:  Vitals:   12/31/23 2000  TempSrc:   PainSc: 0-No pain                 Willian Harrow

## 2023-12-31 NOTE — Op Note (Signed)
 Procedure: 1.  Cystoscopy with left retrograde pyelogram and interpretation. 2.  Left ureteroscopy with holmium laser application, stone extraction and insertion of left double-J stent with tether. 3.  Application of fluoroscopy.  Preop diagnosis: Left distal ureteral stone.  Postop diagnosis: Same.  Surgeon: Dr. Homero Luster.  Anesthesia: General.  Specimen: Stone fragments.  Drains: 6 French by 24 cm left Contour double-J stent with tether.  EBL: None.  Complications: None.  Indications: The patient is a 33 year old female with a history of stones who has been trying to pass a 7 mm left ureteral stone for the last 45 days.  She presented to the [facility] ER today with hematuria and pain that was difficult to manage.  A KUB demonstrated the stone in the upper distal ureter.  She was transferred to [facility] for definitive management.  Procedure: She was taken to the operating room and a general anesthetic was induced.  She had been given Rocephin  earlier in the day.  She was placed in lithotomy position and fitted with PAS hose.  Her perineum and genitalia were prepped with Betadine solution and she was draped in usual sterile fashion.  Cystoscopy was performed using the 21 [place] scope and 30 degree lens.  Examination revealed a normal urethra.  The bladder urine was bloody but after irrigation she was noted to have no mucosal lesions and no trabeculation with normal ureteral orifices but there was bloody efflux from the left ureteral orifice.  Left ureteral orifice was then cannulated with a 5 [place] open-ended catheter and Omnipaque  was instilled.  Left retrograde pyelogram revealed a normal caliber ureter up to a filling defect in the upper distal ureter consistent with her stone which was visible on preretrograde fluoroscopy.  There was some contrast that passed into a dilated more proximal ureter.  After completion of the retrograde pyelogram a sensor wire was  passed to the kidney under fluoroscopic guidance.  The cystoscope was removed and the ureter was dilated with a 11/13 French 28 cm digital access sheath.  Initially the inner core was passed followed by the assembled sheath both without difficulty.  A 6.5 French semirigid ureteroscope was then advanced to the kidney and the stone was identified.  The stone was then fragmented using the 365 m holmium laser fiber with the laser set on the dusting setting.  The 0.3 J x 63 Hz left pedal was used for the fragmentation and the stone broke up readily.  The stone fragments were then removed using the engage basket and once all fragments had been removed to the bladder final inspection revealed only minimal mucosal irritation from the stone but it was felt stenting was indicated.  The cystoscope was then reinserted into the bladder and the stone fragments were retrieved.  The cystoscope was inserted over the wire and a 6 [place] by 24 cm contour double-J stent was placed to the left kidney under fluoroscopic guidance.  The wire was removed, leaving a good coil in the kidney and a good coil in the bladder.  The bladder was then drained and the cystoscope was removed with a stent string exiting the urethra.  The string was tied close to the meatus, trimmed to an appropriate length and tucked vaginally.  She was taken down from lithotomy position, her anesthetic was reversed and she was moved recovery in stable condition.  She was given the stone fragments.

## 2023-12-31 NOTE — Transfer of Care (Signed)
 Immediate Anesthesia Transfer of Care Note  Patient: [name] [name]  Procedure(s) Performed: CYSTOSCOPY/URETEROSCOPY/HOLMIUM LASER/STENT PLACEMENT  Patient Location: PACU  Anesthesia Type:General  Level of Consciousness: awake, alert , and oriented  Airway & Oxygen Therapy: Patient Spontanous Breathing  Post-op Assessment: Report given to RN and Post -op Vital signs reviewed and stable  Post vital signs: Reviewed and stable  Last Vitals:  Vitals Value Taken Time  BP 117/74 12/31/23 18:53  Temp    Pulse 80 12/31/23 18:59  Resp 21 12/31/23 18:59  SpO2 97 % 12/31/23 18:59  Vitals shown include unfiled device data.  Last Pain:  Vitals:   12/31/23 1748  TempSrc: Oral  PainSc: 9          Complications: No notable events documented.

## 2023-12-31 NOTE — ED Triage Notes (Signed)
 Patient has left sided kidney stones. Seen at the doctor may 9th. Has blood in her urine and has not been able to fully empty her bladder in 24 hours.

## 2023-12-31 NOTE — ED Provider Notes (Signed)
 [place] EMERGENCY DEPARTMENT AT [facility] Provider Note   CSN: [id number] Arrival date & time: 12/31/23  1458     Patient presents with: Flank Pain   [name] [name] is a 33 y.o. female presents to emergency department from [facility] ED for uroconsult and procedure to remove stone.  Was diagnosed with 7 mm left proximal ureteral stone with hydronephrosis on 11/22/2023 and has been taking Flomax  for this without relief.    Today, she sought ED evaluation as she has gross hematuria, worsening pain, urinary dribbling.  [facility] ED obtained abdominal x-ray today that showed 6 mm calcification in the left hemipelvis which could represent a distal left urethral stone.     Flank Pain       Prior to Admission medications   Medication Sig Start Date End Date Taking? Authorizing Provider  DULoxetine  (CYMBALTA ) 30 MG capsule Take 3 capsules (90 mg total) by mouth daily. 08/07/23 12/31/23 Yes Madie Schilling, MD  hydrOXYzine  (ATARAX ) 25 MG tablet Take 1 tablet (25 mg total) by mouth at bedtime. 07/02/23  Yes Madie Schilling, MD  ibuprofen  (ADVIL ) 800 MG tablet Take 800 mg by mouth 3 (three) times daily as needed for moderate pain. 10/06/22  Yes [provider]  ondansetron  (ZOFRAN -ODT) 8 MG disintegrating tablet Take 8 mg by mouth every 8 (eight) hours as needed for nausea or vomiting.   Yes [provider]  propranolol  (INDERAL ) 20 MG tablet Take 1 tablet (20 mg total) by mouth 2 (two) times daily as needed (Anxiety). 08/07/23  Yes Madie Schilling, MD  tamsulosin  (FLOMAX ) 0.4 MG CAPS capsule Take 0.4 mg by mouth daily. 11/22/23  Yes [provider]  oxyCODONE -acetaminophen  (PERCOCET/ROXICET) 5-325 MG tablet Take 1 tablet by mouth every 6 (six) hours as needed. 12/31/23   Homero Luster, MD  tamsulosin  (FLOMAX ) 0.4 MG CAPS capsule Take 1 capsule (0.4 mg total) by mouth daily after supper. 12/18/23   Trent Frizzle, MD    Allergies: Patient has no  known allergies.    Review of Systems  Genitourinary:  Positive for flank pain.    Updated Vital Signs BP 130/72 (BP Location: Left Arm)   Pulse 73   Temp 98.3 F (36.8 C)   Resp 13   Ht 5' 8 (1.727 m)   Wt 103.4 kg   LMP 12/23/2023 (Exact Date)   SpO2 100%   BMI 34.67 kg/m   Physical Exam Vitals and nursing note reviewed.  Constitutional:      General: She is not in acute distress.    Appearance: Normal appearance.  HENT:     Head: Normocephalic and atraumatic.   Eyes:     Conjunctiva/sclera: Conjunctivae normal.    Cardiovascular:     Rate and Rhythm: Normal rate.  Pulmonary:     Effort: Pulmonary effort is normal. No respiratory distress.     Breath sounds: Normal breath sounds.  Abdominal:     Tenderness: There is abdominal tenderness in the left lower quadrant.   Musculoskeletal:     Right lower leg: No edema.     Left lower leg: No edema.   Skin:    Capillary Refill: Capillary refill takes less than 2 seconds.     Coloration: Skin is not jaundiced or pale.   Neurological:     Mental Status: She is alert and oriented to person, place, and time. Mental status is at baseline.     (all labs ordered are listed, but only abnormal results are  displayed) Labs Reviewed - No data to display  EKG: None  Radiology: DG C-Arm 1-60 Min-No Report Result Date: 12/31/2023 Fluoroscopy was utilized by the requesting physician.  No radiographic interpretation.     Procedures   Medications Ordered in the ED  oxyCODONE  (Oxy IR/ROXICODONE ) immediate release tablet 5 mg (has no administration in time range)    Or  oxyCODONE  (ROXICODONE ) 5 MG/5ML solution 5 mg (has no administration in time range)  fentaNYL  (SUBLIMAZE ) injection 25-50 mcg (has no administration in time range)  acetaminophen  (OFIRMEV ) IV 1,000 mg (has no administration in time range)  droperidol (INAPSINE) 2.5 MG/ML injection 0.625 mg (has no administration in time range)  lactated ringers   infusion ( Intravenous Anesthesia Volume Adjustment 12/31/23 1850)  sodium chloride  irrigation 0.9 % (3,000 mLs Bladder Irrigation Given 12/31/23 1820)  iohexol  (OMNIPAQUE ) 300 MG/ML solution (3 mLs  Given 12/31/23 1833)  ondansetron  (ZOFRAN -ODT) disintegrating tablet 4 mg (4 mg Oral Given 12/31/23 1648)  ketorolac  (TORADOL ) 15 MG/ML injection 15 mg (15 mg Intravenous Given 12/31/23 1704)  chlorhexidine  (PERIDEX ) 0.12 % solution 15 mL (15 mLs Mouth/Throat Given 12/31/23 1751)                                    Medical Decision Making Risk Prescription drug management. Decision regarding hospitalization.   Patient presents to the ED for concern of urinary symptoms, LLQ abdominal pain, this involves an extensive number of treatment options, and is a complaint that carries with it a high risk of complications and morbidity.  The differential diagnosis includes rupture kidney stone, infected kidney stone, intra-abdominal pathology   Co morbidities that complicate the patient evaluation  Recently diagnosed with kidney stone and hydronephrosis 1 month ago   Additional history obtained:  Additional history obtained from Nursing and Outside Medical Records   External records from outside source obtained and reviewed including triage RN note, ED note from [facility] ED today, CT imaging of 11/22/2023 when stone was diagnosed, urology note from 12/18/2023   Lab Tests:  I Ordered, and personally interpreted labs.  The pertinent results include:   No leukocytosis UA shows trace leuks, large blood, RBC, 15 WBC, rare bacteria, 5 squamous epithelial    Medicines ordered and prescription drug management:  I ordered medication including Zofran , Toradol  for pain Reevaluation of the patient after these medicines showed that the patient improved I have reviewed the patients home medicines and have made adjustments as needed     Consultations Obtained:  I requested consultation with urology Dr.  Inga Manges,  and discussed lab and imaging findings as well as pertinent plan - they recommend:  Urology admission NPO   Problem List / ED Course:  Kidney stone Left flank pain UA does not appear grossly infected.  No leukocytosis Provided Zofran  and Toradol  for pain Consulted Dr. Inga Manges with urology who reported that he will proceed with procedure within the next few hours.  He does not require any further CT imaging as he is able to review previous CT, KUB from today. See his note   Reevaluation:  After the interventions noted above, I reevaluated the patient and found that they have :improved    Dispostion:  After consideration of the diagnostic results and the patients response to treatment, I feel that the patent would benefit from admission with uro consult/procedure.   Final diagnoses:  Kidney stone  Left flank pain    ED Discharge  Orders          Ordered    oxyCODONE -acetaminophen  (PERCOCET/ROXICET) 5-325 MG tablet  Every 6 hours PRN        12/31/23 1909               Royann Cords, PA 12/31/23 2332    Arvilla Birmingham, MD 01/01/24 [phone]

## 2023-12-31 NOTE — Consult Note (Signed)
 Subjective: 1. Kidney stone   2. Left flank pain      Consult requested by Paris Bolds PA  [name] [name] is a 33 yo female with a history of stones who has a 7mm left ureteral stone that was first diagnosed on CT in early May.   She was seen in [place] on 6/3 and had a KUB that didn't clearly show the stone but in hindsight is was over the pelvic bones.  A KUB today shows further migration into the upper distal ureter.   She was transferred from [facility] ED for intractable pain but is reasonably comfortable now.  She has had no fever or chills and her UA today had 1547 RBC's and 15 WBC's but was nit negative and she had a normal WBC count and chemistries.   She has had multiple prior stones    ROS:  Review of Systems  Constitutional:  Negative for fever.  Gastrointestinal:  Positive for nausea.  Genitourinary:  Positive for flank pain.  All other systems reviewed and are negative.   No Known Allergies  Past Medical History:  Diagnosis Date   ADHD (attention deficit hyperactivity disorder)    ADHD (attention deficit hyperactivity disorder)    Alcohol use disorder 01/23/2023   Anxiety    Bipolar 1 disorder (HCC)    Bipolar 1 disorder (HCC)    Chorioamnionitis 11/06/2013   Depression    Hx of pre-eclampsia in prior pregnancy, currently pregnant 11/05/2013   Hypertension affecting pregnancy    HX OF HTN; NO MEDS   IBS (irritable bowel syndrome)    Kidney stone    Left ureteral calculus    lithrotripsy   Missed abortion 12/28/2011   Renal disorder    Schizoaffective disorder (HCC) 11/05/2013   Schizophrenia (HCC)    MILD   Schizophrenia (HCC)    Vaginal delivery 11/06/2013    Past Surgical History:  Procedure Laterality Date   CYSTOSCOPY/RETROGRADE/URETEROSCOPY  08/25/2011   Procedure: CYSTOSCOPY/RETROGRADE/URETEROSCOPY;  Surgeon: Moise Anes, MD;  Location: [facility];  Service: Urology;  Laterality: Left;  cystoscopy LEFT retrograde pyelogram LEFT  URETEROSCOPY holmium  LASER LITHO AND BASKET EXTRACTION C-ARM LASER   DILATION AND EVACUATION  12/29/2011   Procedure: DILATATION AND EVACUATION;  Surgeon: Madelene Schanz, MD;  Location: [facility];  Service: Gynecology;  Laterality: N/A;   INCISION AND DRAINAGE ABSCESS N/A 04/01/2019   Procedure: OPEN INCISION AND DRAINAGE OF DEEP POSTERIOR SPINE LUMBOSACRAL HEMATOMA;  Surgeon: Saundra Curl, MD;  Location: [facility];  Service: Orthopedics;  Laterality: N/A;   KIDNEY STONE SURGERY  07/2011   9 MM   WISDOM TOOTH EXTRACTION  2009- oral md office    Social History   Socioeconomic History   Marital status: Single    Spouse name: Not on file   Number of children: 1   Years of education: 10   Highest education level: Not on file  Occupational History   Not on file  Tobacco Use   Smoking status: Every Day    Current packs/day: 0.00    Types: Cigarettes, E-cigarettes    Last attempt to quit: 11/12/2011    Years since quitting: 12.1   Smokeless tobacco: Never   Tobacco comments:    Vape cartridge lasts 2 weeks.  Started smoking cigarettes at age 63 and now infrequent use  Vaping Use   Vaping status: Some Days   Substances: Nicotine  Substance and Sexual Activity   Alcohol use: Not Currently  Alcohol/week: 4.0 standard drinks of alcohol    Types: 2 Glasses of wine, 2 Cans of beer per week    Comment: Social 1-2 times per month as of 07/02/2023. Previously, 2 beers per week, with 2 glasses of wine on the weekend   Drug use: Not Currently    Types: Crack cocaine, Oxycodone , Marijuana, Cocaine    Comment: See psychiatry note from 01/23/2023   Sexual activity: Yes    Partners: Male    Birth control/protection: None  Other Topics Concern   Not on file  Social History Narrative   RAPED @ 15 YOA ON HER BIRTHDAY   Social Drivers of Health   Financial Resource Strain: Low Risk  (12/31/2023)   Received from [facility]   Overall Financial Resource Strain (CARDIA)     How hard is it for you to pay for the very basics like food, housing, medical care, and heating?: Not hard at all  Food Insecurity: No Food Insecurity (12/31/2023)   Received from [facility]   Hunger Vital Sign    Within the past 12 months, you worried that your food would run out before you got the money to buy more.: Never true    Within the past 12 months, the food you bought just didn't last and you didn't have money to get more.: Never true  Transportation Needs: No Transportation Needs (12/31/2023)   Received from [facility]   PRAPARE - Transportation    Lack of Transportation (Medical): No    Lack of Transportation (Non-Medical): No  Physical Activity: Inactive (12/31/2023)   Received from [facility]   Exercise Vital Sign    On average, how many days per week do you engage in moderate to strenuous exercise (like a brisk walk)?: 0 days    On average, how many minutes do you engage in exercise at this level?: 0 min  Stress: Stress Concern Present (12/31/2023)   Received from [facility] - Occupational Stress Questionnaire    Do you feel stress - tense, restless, nervous, or anxious, or unable to sleep at night because your mind is troubled all the time - these days?: To some extent  Social Connections: Socially Isolated (12/31/2023)   Received from [facility]   Social Connection and Isolation Panel    In a typical week, how many times do you talk on the phone with family, friends, or neighbors?: Twice a week    How often do you get together with friends or relatives?: Once a week    How often do you attend church or religious services?: Never    Do you belong to any clubs or organizations such as church groups, unions, fraternal or athletic groups, or school groups?: No    How often do you attend meetings of the clubs or organizations you belong to?: Never    Are you married, widowed, divorced, separated, never  married, or living with a partner?: Separated  Intimate Partner Violence: Not At Risk (12/31/2023)   Received from [facility]   Humiliation, Afraid, Rape, and Kick questionnaire    Within the last year, have you been afraid of your partner or ex-partner?: No    Within the last year, have you been humiliated or emotionally abused in other ways by your partner or ex-partner?: No    Within the last year, have you been kicked, hit, slapped, or otherwise physically hurt by your partner  or ex-partner?: No    Within the last year, have you been raped or forced to have any kind of sexual activity by your partner or ex-partner?: No    Family History  Problem Relation Age of Onset   Heart disease Paternal Grandfather    Heart disease Maternal Grandmother    Hypertension Maternal Grandmother    Other Mother        VARICOSE VEINS   Anxiety disorder Mother    Diabetes Maternal Grandfather     Anti-infectives: Anti-infectives (From admission, onward)    None       No current facility-administered medications for this encounter.   Current Outpatient Medications  Medication Sig Dispense Refill   DULoxetine  (CYMBALTA ) 30 MG capsule Take 3 capsules (90 mg total) by mouth daily. 90 capsule 2   hydrOXYzine  (ATARAX ) 25 MG tablet Take 1 tablet (25 mg total) by mouth at bedtime. 30 tablet 2   ibuprofen  (ADVIL ) 800 MG tablet Take 800 mg by mouth 3 (three) times daily as needed for moderate pain.     oxyCODONE -acetaminophen  (PERCOCET/ROXICET) 5-325 MG tablet Take 1 tablet by mouth every 6 (six) hours as needed.     propranolol  (INDERAL ) 20 MG tablet Take 1 tablet (20 mg total) by mouth 2 (two) times daily as needed (Anxiety). 60 tablet 2   tamsulosin  (FLOMAX ) 0.4 MG CAPS capsule Take 0.4 mg by mouth daily.     tamsulosin  (FLOMAX ) 0.4 MG CAPS capsule Take 1 capsule (0.4 mg total) by mouth daily after supper. 90 capsule 11     Objective: Vital signs in last 24 hours: BP (!) 140/96   Pulse 80    Temp 99.2 F (37.3 C)   Resp 19   Ht 5' 8 (1.727 m)   Wt 103.4 kg   LMP 11/12/2023 Comment: Pt relates she took 2 upregs at home yesterday, both negative. Says her periods are irregular.  SpO2 100%   BMI 34.67 kg/m   Intake/Output from previous day: No intake/output data recorded. Intake/Output this shift: No intake/output data recorded.   Physical Exam Vitals reviewed.  Constitutional:      Appearance: Normal appearance. She is obese.   Cardiovascular:     Rate and Rhythm: Normal rate and regular rhythm.     Heart sounds: Normal heart sounds.  Pulmonary:     Effort: Pulmonary effort is normal. No respiratory distress.     Breath sounds: Normal breath sounds.  Abdominal:     Palpations: Abdomen is soft.     Tenderness: There is abdominal tenderness (LLQ).   Neurological:     Mental Status: She is alert.     Lab Results:  No results found for this or any previous visit (from the past 24 hours).  BMET No results for input(s): NA, K, CL, CO2, GLUCOSE, BUN, CREATININE, CALCIUM  in the last 72 hours. PT/INR No results for input(s): LABPROT, INR in the last 72 hours. ABG No results for input(s): PHART, HCO3 in the last 72 hours.  Invalid input(s): PCO2, PO2 Labs and UA from [facility] reviewed.    Studies/Results: No results found. KUB's from [facility] and [facility] reviewed.   Assessment/Plan: 6-103mm left distal stone with pain.   I have discussed the options including continued MET, ESWL and URS but she would like to have it out.  I have reviewed the risks of bleeding, infection, ureteral injury, need for secondary procedures, need for a stent, thrombotic events and anesthetic complications.      Meds ordered  this encounter  Medications   ondansetron  (ZOFRAN -ODT) disintegrating tablet 4 mg   ketorolac  (TORADOL ) 15 MG/ML injection 15 mg        No follow-ups on file.         Jahni Nazar 12/31/2023

## 2023-12-31 NOTE — Anesthesia Procedure Notes (Signed)
 Procedure Name: LMA Insertion Date/Time: 12/31/2023 6:09 PM  Performed by: Alfreda Imus, CRNAPre-anesthesia Checklist: Patient identified, Emergency Drugs available, Suction available, Patient being monitored and Timeout performed Patient Re-evaluated:Patient Re-evaluated prior to induction Oxygen Delivery Method: Circle system utilized Preoxygenation: Pre-oxygenation with 100% oxygen Induction Type: IV induction LMA: LMA inserted LMA Size: 4.0 Number of attempts: 1 Placement Confirmation: positive ETCO2 and CO2 detector Tube secured with: Tape Dental Injury: Teeth and Oropharynx as per pre-operative assessment

## 2023-12-31 NOTE — ED Provider Triage Note (Addendum)
 Emergency Medicine Provider Triage Evaluation Note  [name] [name] , a 33 y.o. female  was evaluated in triage.  Pt complains of stone. Was diagnosed with stone on 11/22/23 - has been taking flomax   Currently complaining of gross hematuria, worsening pain. Was evaluated earlier by [facility] ER earlier today and sent here for uro consult. Had CT renal study on 5/8 and abd XR today. Dr. Myrtle Atta at [facility] urology was consulted who recommended ED to ED transfer to [facility] and admission with uro consult  XR from today shows 6 mm calcification in the left hemipelvis could represent a distal left ureteral stone. No additional evidence of renal or ureteral stones  Review of Systems  Positive: See hpi Negative:   Physical Exam  BP (!) 140/96   Pulse 80   Temp 99.2 F (37.3 C)   Resp 19   Ht 5' 8 (1.727 m)   Wt 103.4 kg   LMP 11/12/2023 Comment: Pt relates she took 2 upregs at home yesterday, both negative. Says her periods are irregular.  SpO2 100%   BMI 34.67 kg/m  Gen:   Awake, no distress   Resp:  Normal effort  MSK:   Moves extremities without difficulty  Other:    Medical Decision Making  Medically screening exam initiated at 3:15 PM.  Appropriate orders placed.  Yoona Ishii was informed that the remainder of the evaluation will be completed by another provider, this initial triage assessment does not replace that evaluation, and the importance of remaining in the ED until their evaluation is complete.    Royann Cords, PA 12/31/23 [phone]

## 2023-12-31 NOTE — Discharge Instructions (Addendum)
   PATIENT INSTRUCTIONS POST-ANESTHESIA  IMMEDIATELY FOLLOWING SURGERY:  Do not drive or operate machinery for the first twenty four hours after surgery.  Do not make any important decisions for twenty four hours after surgery or while taking narcotic pain medications or sedatives.  If you develop intractable nausea and vomiting or a severe headache please notify your doctor immediately.  FOLLOW-UP:  Please make an appointment with your surgeon as instructed. You do not need to follow up with anesthesia unless specifically instructed to do so.  WOUND CARE INSTRUCTIONS (if applicable):  Keep a dry clean dressing on the anesthesia/puncture wound site if there is drainage.  Once the wound has quit draining you may leave it open to air.  Generally you should leave the bandage intact for twenty four hours unless there is drainage.  If the epidural site drains for more than 36-48 hours please call the anesthesia department.  QUESTIONS?:  Please feel free to call your physician or the hospital operator if you have any questions, and they will be happy to assist you.      You may remove the stent by pulling the attached string that is tucked  vaginally.  You can get it out Thursday morning but if you don't feel comfortable doing that please call the office to have it removed.    Please bring the stone fragments to the office when you return.

## 2024-01-01 ENCOUNTER — Encounter (HOSPITAL_COMMUNITY): Payer: Self-pay | Admitting: Urology

## 2024-01-08 ENCOUNTER — Ambulatory Visit (HOSPITAL_COMMUNITY): Payer: MEDICAID

## 2024-01-10 NOTE — Progress Notes (Signed)
 Name: [name] [name] DOB: Dec 25, 1990 MRN: [id number]  Diagnoses: 1) Post-operative state  HPI: [name] [name] presents post-operatively.  Relevant history includes:  1. Kidney stones, recurrent.  She underwent the following procedure on 12/31/2023 by Dr. Watt for management of a 6-7 mm left distal ureteral stone.  Procedure:  1.  Cystoscopy with left retrograde pyelogram and interpretation. 2.  Left ureteroscopy with holmium laser application, stone extraction and insertion of left double-J stent with tether. 3.  Application of fluoroscopy.   Preop diagnosis: Left distal ureteral stone.   Postop diagnosis: Same.  Postop course: She reports that she pulled the tethered stent 1-2 days postop with no problem. Today She denies increased urinary urgency, frequency, nocturia, dysuria, gross hematuria, hesitancy, straining to void, or sensations of incomplete emptying. She denies flank pain or abdominal pain.  Medications: Current Outpatient Medications  Medication Sig Dispense Refill   DULoxetine  (CYMBALTA ) 30 MG capsule Take 3 capsules (90 mg total) by mouth daily. 90 capsule 2   propranolol  (INDERAL ) 20 MG tablet Take 1 tablet (20 mg total) by mouth 2 (two) times daily as needed (Anxiety). 60 tablet 2   hydrOXYzine  (ATARAX ) 25 MG tablet Take 1 tablet (25 mg total) by mouth at bedtime. (Patient not taking: Reported on 01/14/2024) 30 tablet 2   ibuprofen  (ADVIL ) 800 MG tablet Take 800 mg by mouth 3 (three) times daily as needed for moderate pain. (Patient not taking: Reported on 01/14/2024)     ondansetron  (ZOFRAN -ODT) 8 MG disintegrating tablet Take 8 mg by mouth every 8 (eight) hours as needed for nausea or vomiting. (Patient not taking: Reported on 01/14/2024)     oxyCODONE -acetaminophen  (PERCOCET/ROXICET) 5-325 MG tablet Take 1 tablet by mouth every 6 (six) hours as needed. (Patient not taking: Reported on 01/14/2024) 8 tablet 0   tamsulosin  (FLOMAX ) 0.4 MG CAPS capsule Take 0.4 mg  by mouth daily. (Patient not taking: Reported on 01/14/2024)     tamsulosin  (FLOMAX ) 0.4 MG CAPS capsule Take 1 capsule (0.4 mg total) by mouth daily after supper. (Patient not taking: Reported on 01/14/2024) 90 capsule 11   No current facility-administered medications for this visit.    Allergies: No Known Allergies  Past Medical History:  Diagnosis Date   ADHD (attention deficit hyperactivity disorder)    ADHD (attention deficit hyperactivity disorder)    Alcohol use disorder 01/23/2023   Anxiety    Bipolar 1 disorder (HCC)    Bipolar 1 disorder (HCC)    Chorioamnionitis 11/06/2013   Depression    Hx of pre-eclampsia in prior pregnancy, currently pregnant 11/05/2013   Hypertension affecting pregnancy    HX OF HTN; NO MEDS   IBS (irritable bowel syndrome)    Kidney stone    Left ureteral calculus    lithrotripsy   Missed abortion 12/28/2011   Renal disorder    Schizoaffective disorder (HCC) 11/05/2013   Schizophrenia (HCC)    MILD   Schizophrenia (HCC)    Vaginal delivery 11/06/2013   Past Surgical History:  Procedure Laterality Date   CYSTOSCOPY/RETROGRADE/URETEROSCOPY  08/25/2011   Procedure: CYSTOSCOPY/RETROGRADE/URETEROSCOPY;  Surgeon: Donnice Gwenyth Brooks, MD;  Location: [facility];  Service: Urology;  Laterality: Left;  cystoscopy LEFT retrograde pyelogram LEFT URETEROSCOPY holmium  LASER LITHO AND BASKET EXTRACTION C-ARM LASER   CYSTOSCOPY/URETEROSCOPY/HOLMIUM LASER/STENT PLACEMENT N/A 12/31/2023   Procedure: CYSTOSCOPY/URETEROSCOPY/HOLMIUM LASER/STENT PLACEMENT;  Surgeon: Watt Rush, MD;  Location: [facility];  Service: Urology;  Laterality: N/A;   DILATION AND EVACUATION  12/29/2011   Procedure: DILATATION  AND EVACUATION;  Surgeon: Jon CINDERELLA Rummer, MD;  Location: [facility];  Service: Gynecology;  Laterality: N/A;   INCISION AND DRAINAGE ABSCESS N/A 04/01/2019   Procedure: OPEN INCISION AND DRAINAGE OF DEEP POSTERIOR SPINE LUMBOSACRAL HEMATOMA;  Surgeon:  Beverley Evalene BIRCH, MD;  Location: [facility];  Service: Orthopedics;  Laterality: N/A;   KIDNEY STONE SURGERY  07/2011   9 MM   WISDOM TOOTH EXTRACTION  2009- oral md office   Family History  Problem Relation Age of Onset   Heart disease Paternal Grandfather    Heart disease Maternal Grandmother    Hypertension Maternal Grandmother    Other Mother        VARICOSE VEINS   Anxiety disorder Mother    Diabetes Maternal Grandfather    Social History   Socioeconomic History   Marital status: Single    Spouse name: Not on file   Number of children: 1   Years of education: 10   Highest education level: Not on file  Occupational History   Not on file  Tobacco Use   Smoking status: Every Day    Current packs/day: 0.00    Types: Cigarettes, E-cigarettes    Last attempt to quit: 11/12/2011    Years since quitting: 12.1   Smokeless tobacco: Never   Tobacco comments:    Vape cartridge lasts 2 weeks.  Started smoking cigarettes at age 81 and now infrequent use  Vaping Use   Vaping status: Some Days   Substances: Nicotine  Substance and Sexual Activity   Alcohol use: Not Currently    Alcohol/week: 4.0 standard drinks of alcohol    Types: 2 Glasses of wine, 2 Cans of beer per week    Comment: Social 1-2 times per month as of 07/02/2023. Previously, 2 beers per week, with 2 glasses of wine on the weekend   Drug use: Not Currently    Types: Crack cocaine, Oxycodone , Marijuana, Cocaine    Comment: See psychiatry note from 01/23/2023   Sexual activity: Yes    Partners: Male    Birth control/protection: None  Other Topics Concern   Not on file  Social History Narrative   RAPED @ 15 YOA ON HER BIRTHDAY   Social Drivers of Health   Financial Resource Strain: Low Risk  (12/31/2023)   Received from [facility]   Overall Financial Resource Strain (CARDIA)    How hard is it for you to pay for the very basics like food, housing, medical care, and heating?: Not hard at all   Food Insecurity: No Food Insecurity (12/31/2023)   Received from [facility]   Hunger Vital Sign    Within the past 12 months, you worried that your food would run out before you got the money to buy more.: Never true    Within the past 12 months, the food you bought just didn't last and you didn't have money to get more.: Never true  Transportation Needs: No Transportation Needs (12/31/2023)   Received from [facility]   PRAPARE - Transportation    Lack of Transportation (Medical): No    Lack of Transportation (Non-Medical): No  Physical Activity: Inactive (12/31/2023)   Received from [facility]   Exercise Vital Sign    On average, how many days per week do you engage in moderate to strenuous exercise (like a brisk walk)?: 0 days    On average, how many minutes do you engage in exercise at this level?: 0  min  Stress: Stress Concern Present (12/31/2023)   Received from [facility] - Occupational Stress Questionnaire    Do you feel stress - tense, restless, nervous, or anxious, or unable to sleep at night because your mind is troubled all the time - these days?: To some extent  Social Connections: Socially Isolated (12/31/2023)   Received from [facility]   Social Connection and Isolation Panel    In a typical week, how many times do you talk on the phone with family, friends, or neighbors?: Twice a week    How often do you get together with friends or relatives?: Once a week    How often do you attend church or religious services?: Never    Do you belong to any clubs or organizations such as church groups, unions, fraternal or athletic groups, or school groups?: No    How often do you attend meetings of the clubs or organizations you belong to?: Never    Are you married, widowed, divorced, separated, never married, or living with a partner?: Separated  Intimate Partner Violence: Not At Risk (12/31/2023)   Received from [facility]   Humiliation, Afraid, Rape, and Kick questionnaire    Within the last year, have you been afraid of your partner or ex-partner?: No    Within the last year, have you been humiliated or emotionally abused in other ways by your partner or ex-partner?: No    Within the last year, have you been kicked, hit, slapped, or otherwise physically hurt by your partner or ex-partner?: No    Within the last year, have you been raped or forced to have any kind of sexual activity by your partner or ex-partner?: No    SUBJECTIVE  Review of Systems Constitutional: Patient denies any unintentional weight loss or change in strength lntegumentary: Patient denies any rashes or pruritus Cardiovascular: Patient denies chest pain or syncope Respiratory: Patient denies shortness of breath Gastrointestinal: Patient denies nausea, vomiting, constipation, or diarrhea  Musculoskeletal: Patient denies muscle cramps or weakness Neurologic: Patient denies convulsions or seizures Allergic/Immunologic: Patient denies recent allergic reaction(s) Hematologic/Lymphatic: Patient denies bleeding tendencies Endocrine: Patient denies heat/cold intolerance  GU: As per HPI.  OBJECTIVE Vitals:   01/14/24 0954  BP: 117/82  Pulse: (!) 108   There is no height or weight on file to calculate BMI.  Physical Examination Constitutional: No obvious distress; patient is non-toxic appearing  Cardiovascular: No visible lower extremity edema.  Respiratory: The patient does not have audible wheezing/stridor; respirations do not appear labored  Gastrointestinal: Abdomen non-distended Musculoskeletal: Normal ROM of UEs  Skin: No obvious rashes/open sores  Neurologic: CN 2-12 grossly intact Psychiatric: Answered questions appropriately with normal affect  Hematologic/Lymphatic/Immunologic: No obvious bruises or sites of spontaneous bleeding  UA: negative  PVR: 2 ml  ASSESSMENT Kidney stones - Plan: Urinalysis,  Routine w reflex microscopic, BLADDER SCAN AMB NON-IMAGING, Stone Analysis, DG Abd 1 View  Postop check - Plan: Urinalysis, Routine w reflex microscopic, BLADDER SCAN AMB NON-IMAGING, Stone Analysis  Left ureteral stone - Plan: Urinalysis, Routine w reflex microscopic, BLADDER SCAN AMB NON-IMAGING, Stone Analysis  We reviewed the operative procedures and findings.  She is doing well. For stone prevention: Advised adequate hydration and we discussed option to consider low oxalate diet given that calcium  oxalate is the most common type of stone. Handout provided about stone prevention diet.  Will plan to follow up in 6 months with [facility]  for stone surveillance or sooner if needed. Patient verbalized understanding of and agreement with current plan. All questions were answered.  PLAN Advised the following: Maintain adequate fluid intake. Low oxalate diet. Return in about 6 months (around 07/15/2024) for Stone, with UA & PVR, will need [facility] prior.   Orders Placed This Encounter  Procedures   DG Abd 1 View    Standing Status:   Future    Expected Date:   07/15/2024    Expiration Date:   01/13/2025    Reason for Exam (SYMPTOM  OR DIAGNOSIS REQUIRED):   kidney stone    Is patient pregnant?:   Unknown (Please Explain)    Preferred imaging location?:   [facility]   Urinalysis, Routine w reflex microscopic   Stone Analysis   BLADDER SCAN AMB NON-IMAGING    It has been explained that the patient is to follow regularly with their PCP in addition to all other providers involved in their care and to follow instructions provided by these respective offices. Patient advised to contact urology clinic if any urologic-pertaining questions, concerns, new symptoms or problems arise in the interim period.  Patient Instructions  >80% of stones are calcium  oxalate. This type of stones forms when body either isn't clearing oxalate well enough, is making too much oxalate, or too little citrate. This  results in oxalate binding to form crystals, which continue to aggregate and form stones.  Limiting calcium  does not help, but limiting oxalate in the diet can help. Increasing citric acid  intake may also help.  The following measures may help to prevent the recurrence of stones: Increase water  intake to 2-2.5 liters per day May add citrus juice (lemon, lime or orange juice) to water  Moderation in dairy foods Decrease in salt content 5. Low Oxalate diet: Oxylates are found in foods like Tomato, Spinach, red wine and chocolate (see additional resources below).  Internet resources for information regarding low oxalate diet: https://kidneystones.yangchunwu.com https://my.VerticalStretch.be  Foods Low in Sodium or Oxalate Foods You Can Eat  Drinks Coffee, fruit and veggie juice (using the recommended veggies), fruit punch  Fruits Apples, apricots (fresh or canned), avocado, bananas, cherries (sweet), cranberries, grapefruit, red or green grapes, lemon and lime juice, melons, nectarines, papayas, peaches, pears, pineapples, oranges, strawberries (fresh), tangerines  Veggies Artichokes, asparagus, bamboo shoots, broccoli, brussels sprouts, cabbage, cauliflower, chayote squash, chicory, corn, cucumbers, endive, lettuce, lima beans, mushrooms, onions, peas, peppers, potatoes, radishes, rutabagas, zucchini  Breads, Cereals, Grains Egg noodles, rye bread, cooked and dry cereals without nuts or bran, crackers with unsalted tops, white or wild rice  Meat, Meat Replacements, Fish, Recruitment consultant, fish, poultry, eggs, egg whites, egg replacements  Soup Homemade soup (using the recommended veggies and meat), low-sodium bouillon, low-sodium canned  Desserts Cookies, cakes, ice cream, pudding without chocolate or nuts, candy without chocolate or nuts  Fats and Oils Butter, margarine, cream, oil, salad dressing, mayo   Other Foods Unsalted potato chips or pretzels, herbs (like garlic, garlic powder, onion powder), lemon juice, salt-free seasoning blends, vinegar  Other Foods Low in Oxalate Foods You Can Eat  Drinks Beer, cola, wine, buttermilk, lemonade or limeade (without added vitamin C), milk  Meat, Meat Replacements, Fish, [employer] meat, ham, bacon, hot dogs, bratwurst, sausage, chicken nuggets, cheddar cheese, canned fish and shellfish  Soup Tomato soup, cheese soup  Other Foods Coconuts, lemon or lime juices, sugar or sweeteners, jellies or jams (from the recommended list)   Moderate-Oxalate Foods Foods to Limit   Drinks  Fruit and veggie juices (from the list below), chocolate milk, rice milk, hot cocoa, tea   Fruits Blackberries, blueberries, black currants, cherries (sour), fruit cocktail, mangoes, orange peel, prunes, purple plums   Veggies Baked beans, carrots, celery, green beans, parsnips, summer squash, tomatoes, turnips   Breads, Cereals, Grains White bread, cornbread or cornmeal, white English muffins, saltine or soda crackers, brown rice, vanilla wafers, spaghetti and other noodles, firm tofu, bagels, oatmeal   Meat/meat replacements, fish, poultry Sardines   Desserts Chocolate cake   Fats and Oils Macadamia nuts, pistachio nuts, English walnuts   Other Foods Jams or jellies (made with the fruits above), pepper    High-Oxalate Foods Foods to Avoid Drinks Chocolate drink mixes, soy milk, Ovaltine, instant iced tea, fruit juices of fruits listed below Fruits Apricots (dried), red currants, figs, kiwi, plums, rhubarb Veggies Beans (wax, dried), beets and beet greens, chives, collard greens, eggplant, escarole, dark greens of all kinds, leeks, okra, parsley, rutabagas, spinach, Swiss chard, tomato paste, watercress Breads, Cereals, Grains Amaranth, barley, white corn flour, fried potatoes, fruitcake, grits, soybean products, sweet potatoes, wheat germ and bran, buckwheat flour, All Bran  cereal, graham crackers, pretzels, whole wheat bread Meat/meat replacements, fish, poultry  Dried beans, peanut butter, soy burgers, miso  Desserts Carob, chocolate, marmalades Fats and Oils Nuts (peanuts, almonds, pecans, cashews, hazelnuts), nut butters, sesame seeds, tahini paste Other Foods Poppy seeds   Electronically signed by:  Lauraine KYM Oz, MSN, FNP-C, CUNP 01/14/2024 10:54 AM

## 2024-01-14 ENCOUNTER — Encounter: Payer: Self-pay | Admitting: Urology

## 2024-01-14 ENCOUNTER — Ambulatory Visit (INDEPENDENT_AMBULATORY_CARE_PROVIDER_SITE_OTHER): Payer: MEDICAID | Admitting: Urology

## 2024-01-14 VITALS — BP 117/82 | HR 108

## 2024-01-14 DIAGNOSIS — Z87442 Personal history of urinary calculi: Secondary | ICD-10-CM | POA: Diagnosis not present

## 2024-01-14 DIAGNOSIS — Z09 Encounter for follow-up examination after completed treatment for conditions other than malignant neoplasm: Secondary | ICD-10-CM

## 2024-01-14 DIAGNOSIS — N200 Calculus of kidney: Secondary | ICD-10-CM

## 2024-01-14 DIAGNOSIS — N201 Calculus of ureter: Secondary | ICD-10-CM

## 2024-01-14 LAB — URINALYSIS, ROUTINE W REFLEX MICROSCOPIC
Bilirubin, UA: NEGATIVE
Glucose, UA: NEGATIVE
Ketones, UA: NEGATIVE
Leukocytes,UA: NEGATIVE
Nitrite, UA: NEGATIVE
Protein,UA: NEGATIVE
RBC, UA: NEGATIVE
Specific Gravity, UA: 1.015 (ref 1.005–1.030)
Urobilinogen, Ur: 0.2 mg/dL (ref 0.2–1.0)
pH, UA: 7 (ref 5.0–7.5)

## 2024-01-14 NOTE — Patient Instructions (Signed)

## 2024-01-23 LAB — STONE ANALYSIS
Calcium Oxalate Dihydrate: 50 %
Calcium Oxalate Monohydrate: 10 %
Calcium Phosphate (Hydroxyl): 40 %
Weight Calculi: 44 mg

## 2024-01-24 ENCOUNTER — Ambulatory Visit: Payer: Self-pay | Admitting: Urology

## 2024-01-24 NOTE — Telephone Encounter (Signed)
-----   Message from Lauraine JAYSON Oz sent at 01/24/2024  9:16 AM EDT ----- Please let pt know stone analysis showed 60% calcium  oxalate and 40% calcium  phosphate composition. Please advise adequate fluid intake (around 2 liters per day) and low oxalate diet for stone  prevention.  ----- Message ----- From: Interface, [facility] Lab Results In Sent: 01/14/2024   3:35 PM EDT To: Sarah C Larocco, FNP

## 2024-01-24 NOTE — Telephone Encounter (Signed)
 Tried calling pt with no answer. My chart message sent.

## 2024-03-05 ENCOUNTER — Other Ambulatory Visit: Payer: Self-pay | Admitting: Urology

## 2024-03-05 DIAGNOSIS — N200 Calculus of kidney: Secondary | ICD-10-CM

## 2024-07-21 ENCOUNTER — Ambulatory Visit: Payer: MEDICAID | Admitting: Urology

## 2024-07-28 NOTE — Progress Notes (Incomplete)
 "    History of Present Illness:  6.3.2025:  here for E/M of a recently diagnosed Lt proximal ureteral stone. She has had kidney stones before, managed at [facility] in [place].  She became symptomatic just under a month ago with left flank pain, nausea and vomiting.  She went to [facility], to the emergency room where CT was performed revealing a 5 x 7 mm left proximal ureteral stone with hydronephrosis.  Punctate left renal stones were also present.  She ended up having URS by Dr Watt on 6.16.2025.  1.13.2026:   Past Medical History:  Past Medical History:  Diagnosis Date   ADHD (attention deficit hyperactivity disorder)    ADHD (attention deficit hyperactivity disorder)    Alcohol use disorder 01/23/2023   Anxiety    Bipolar 1 disorder (HCC)    Bipolar 1 disorder (HCC)    Chorioamnionitis 11/06/2013   Depression    Hx of pre-eclampsia in prior pregnancy, currently pregnant 11/05/2013   Hypertension affecting pregnancy    HX OF HTN; NO MEDS   IBS (irritable bowel syndrome)    Kidney stone    Left ureteral calculus    lithrotripsy   Missed abortion 12/28/2011   Renal disorder    Schizoaffective disorder (HCC) 11/05/2013   Schizophrenia (HCC)    MILD   Schizophrenia (HCC)    Vaginal delivery 11/06/2013    Past Surgical History:  Past Surgical History:  Procedure Laterality Date   CYSTOSCOPY/RETROGRADE/URETEROSCOPY  08/25/2011   Procedure: CYSTOSCOPY/RETROGRADE/URETEROSCOPY;  Surgeon: Donnice Gwenyth Brooks, MD;  Location: [facility];  Service: Urology;  Laterality: Left;  cystoscopy LEFT retrograde pyelogram LEFT URETEROSCOPY holmium  LASER LITHO AND BASKET EXTRACTION C-ARM LASER   CYSTOSCOPY/URETEROSCOPY/HOLMIUM LASER/STENT PLACEMENT N/A 12/31/2023   Procedure: CYSTOSCOPY/URETEROSCOPY/HOLMIUM LASER/STENT PLACEMENT;  Surgeon: Watt Rush, MD;  Location: [facility];  Service: Urology;  Laterality: N/A;   DILATION AND EVACUATION   12/29/2011   Procedure: DILATATION AND EVACUATION;  Surgeon: Jon CINDERELLA Rummer, MD;  Location: [facility];  Service: Gynecology;  Laterality: N/A;   INCISION AND DRAINAGE ABSCESS N/A 04/01/2019   Procedure: OPEN INCISION AND DRAINAGE OF DEEP POSTERIOR SPINE LUMBOSACRAL HEMATOMA;  Surgeon: Beverley Evalene BIRCH, MD;  Location: [facility];  Service: Orthopedics;  Laterality: N/A;   KIDNEY STONE SURGERY  07/2011   9 MM   WISDOM TOOTH EXTRACTION  2009- oral md office    Allergies:  No Known Allergies  Family History:  Family History  Problem Relation Age of Onset   Heart disease Paternal Grandfather    Heart disease Maternal Grandmother    Hypertension Maternal Grandmother    Other Mother        VARICOSE VEINS   Anxiety disorder Mother    Diabetes Maternal Grandfather     Social History:  Social History   Tobacco Use   Smoking status: Every Day    Current packs/day: 0.00    Average packs/day: 0.3 packs/day    Types: Cigarettes, E-cigarettes    Last attempt to quit: 11/12/2011    Years since quitting: 12.7   Smokeless tobacco: Never   Tobacco comments:    Vape cartridge lasts 2 weeks.  Started smoking cigarettes at age 70 and now infrequent use  Vaping Use   Vaping status: Some Days   Substances: Nicotine  Substance Use Topics   Alcohol use: Not Currently    Alcohol/week: 4.0 standard drinks of alcohol    Types: 2 Glasses of wine, 2 Cans of  beer per week    Comment: Social 1-2 times per month as of 07/02/2023. Previously, 2 beers per week, with 2 glasses of wine on the weekend   Drug use: Not Currently    Types: Crack cocaine, Oxycodone , Marijuana, Cocaine    Comment: See psychiatry note from 01/23/2023    Review of symptoms:  Constitutional:  Negative for unexplained weight loss, night sweats, fever, chills ENT:  Negative for nose bleeds, sinus pain, painful swallowing CV:  Negative for chest pain, shortness of breath, exercise intolerance, palpitations, loss of  consciousness Resp:  Negative for cough, wheezing, shortness of breath GI:  Negative for nausea, vomiting, diarrhea, bloody stools GU:  Positives noted in HPI; otherwise negative for gross hematuria, dysuria, urinary incontinence Neuro:  Negative for seizures, poor balance, limb weakness, slurred speech Psych:  Negative for lack of energy, depression, anxiety Endocrine:  Negative for polydipsia, polyuria, symptoms of hypoglycemia (dizziness, hunger, sweating) Hematologic:  Negative for anemia, purpura, petechia, prolonged or excessive bleeding, use of anticoagulants  Allergic:  Negative for difficulty breathing or choking as a result of exposure to anything; no shellfish allergy; no allergic response (rash/itch) to materials, foods  Physical exam: There were no vitals taken for this visit. GENERAL APPEARANCE:  Well appearing, well developed, well nourished, NAD HEENT: Atraumatic, Normocephalic, oropharynx clear. NECK: Supple without lymphadenopathy or thyromegaly. LUNGS: Clear to auscultation bilaterally. HEART: Regular Rate and Rhythm without murmurs, gallops, or rubs MENTAL STATUS:  Appropriate. BACK:  Non-tender to palpation.  No CVAT SKIN:  Warm, dry and intact.    Results: No results found for this or any previous visit (from the past 24 hours).  I have reviewed prior patient's records  I have reviewed referring/[facility] physicians [facility]  No 24-hour urine results available  I have reviewed urinalysis--no evidence of infection  I reviewed prior imaging studies--CT results from [facility] reviewed    "

## 2024-07-29 ENCOUNTER — Ambulatory Visit: Payer: MEDICAID | Admitting: Urology

## 2024-07-29 DIAGNOSIS — Z87442 Personal history of urinary calculi: Secondary | ICD-10-CM
